# Patient Record
Sex: Male | Born: 1965 | Hispanic: No | Marital: Single | Smoking: Current every day smoker
Health system: Southern US, Community
[De-identification: ages and names within clinical notes are randomized; demographics above are authoritative.]

## PROBLEM LIST (undated history)

## (undated) DIAGNOSIS — F1010 Alcohol abuse, uncomplicated: Secondary | ICD-10-CM

## (undated) DIAGNOSIS — J449 Chronic obstructive pulmonary disease, unspecified: Secondary | ICD-10-CM

## (undated) HISTORY — PX: ELEVATION OF DEPRESSED SKULL FRACTURE: SUR431

## (undated) HISTORY — PX: SHOULDER SURGERY: SHX246

---

## 2017-11-11 ENCOUNTER — Inpatient Hospital Stay (HOSPITAL_COMMUNITY)
Admission: EM | Admit: 2017-11-11 | Discharge: 2017-11-18 | DRG: 193 | Disposition: A | Discharge status: Home / Self Care | Payer: Self-pay | Source: Other Acute Inpatient Hospital | Attending: Internal Medicine | Admitting: Internal Medicine

## 2017-11-11 ENCOUNTER — Encounter (HOSPITAL_COMMUNITY): Payer: Self-pay | Admitting: Internal Medicine

## 2017-11-11 ENCOUNTER — Other Ambulatory Visit: Payer: Self-pay

## 2017-11-11 DIAGNOSIS — F17200 Nicotine dependence, unspecified, uncomplicated: Secondary | ICD-10-CM

## 2017-11-11 DIAGNOSIS — J984 Other disorders of lung: Secondary | ICD-10-CM

## 2017-11-11 DIAGNOSIS — F1020 Alcohol dependence, uncomplicated: Secondary | ICD-10-CM | POA: Diagnosis present

## 2017-11-11 DIAGNOSIS — D649 Anemia, unspecified: Secondary | ICD-10-CM | POA: Diagnosis present

## 2017-11-11 DIAGNOSIS — Z6822 Body mass index (BMI) 22.0-22.9, adult: Secondary | ICD-10-CM

## 2017-11-11 DIAGNOSIS — K769 Liver disease, unspecified: Secondary | ICD-10-CM | POA: Diagnosis present

## 2017-11-11 DIAGNOSIS — J188 Other pneumonia, unspecified organism: Principal | ICD-10-CM | POA: Diagnosis present

## 2017-11-11 DIAGNOSIS — Z79891 Long term (current) use of opiate analgesic: Secondary | ICD-10-CM

## 2017-11-11 DIAGNOSIS — F1021 Alcohol dependence, in remission: Secondary | ICD-10-CM

## 2017-11-11 DIAGNOSIS — I10 Essential (primary) hypertension: Secondary | ICD-10-CM | POA: Diagnosis present

## 2017-11-11 DIAGNOSIS — J449 Chronic obstructive pulmonary disease, unspecified: Secondary | ICD-10-CM | POA: Diagnosis present

## 2017-11-11 DIAGNOSIS — R911 Solitary pulmonary nodule: Secondary | ICD-10-CM | POA: Diagnosis present

## 2017-11-11 DIAGNOSIS — Z7951 Long term (current) use of inhaled steroids: Secondary | ICD-10-CM

## 2017-11-11 DIAGNOSIS — Z8042 Family history of malignant neoplasm of prostate: Secondary | ICD-10-CM

## 2017-11-11 DIAGNOSIS — J9589 Other postprocedural complications and disorders of respiratory system, not elsewhere classified: Secondary | ICD-10-CM

## 2017-11-11 DIAGNOSIS — Z803 Family history of malignant neoplasm of breast: Secondary | ICD-10-CM

## 2017-11-11 DIAGNOSIS — F17210 Nicotine dependence, cigarettes, uncomplicated: Secondary | ICD-10-CM | POA: Diagnosis present

## 2017-11-11 DIAGNOSIS — Z79899 Other long term (current) drug therapy: Secondary | ICD-10-CM

## 2017-11-11 DIAGNOSIS — J189 Pneumonia, unspecified organism: Secondary | ICD-10-CM

## 2017-11-11 DIAGNOSIS — Z716 Tobacco abuse counseling: Secondary | ICD-10-CM

## 2017-11-11 DIAGNOSIS — J40 Bronchitis, not specified as acute or chronic: Secondary | ICD-10-CM

## 2017-11-11 DIAGNOSIS — E43 Unspecified severe protein-calorie malnutrition: Secondary | ICD-10-CM | POA: Diagnosis present

## 2017-11-11 DIAGNOSIS — E871 Hypo-osmolality and hyponatremia: Secondary | ICD-10-CM | POA: Diagnosis present

## 2017-11-11 DIAGNOSIS — J432 Centrilobular emphysema: Secondary | ICD-10-CM | POA: Diagnosis present

## 2017-11-11 HISTORY — DX: Alcohol abuse, uncomplicated: F10.10

## 2017-11-11 HISTORY — DX: Chronic obstructive pulmonary disease, unspecified: J44.9

## 2017-11-11 LAB — MRSA PCR SCREENING: MRSA BY PCR: NEGATIVE

## 2017-11-11 MED ORDER — IPRATROPIUM-ALBUTEROL 0.5-2.5 (3) MG/3ML IN SOLN: 3.0000 mL | RESPIRATORY_TRACT | Status: DC | PRN

## 2017-11-11 MED ORDER — CARVEDILOL 3.125 MG PO TABS
3.1250 mg | ORAL_TABLET | Freq: Two times a day (BID) | ORAL | Status: DC
  Administered 2017-11-11 – 2017-11-18 (×14): 3.125 mg via ORAL
  Filled 2017-11-11 (×14): qty 1

## 2017-11-11 MED ORDER — NICOTINE 14 MG/24HR TD PT24
14.0000 mg | MEDICATED_PATCH | Freq: Every day | TRANSDERMAL | Status: DC
  Filled 2017-11-11: qty 1

## 2017-11-11 MED ORDER — SODIUM CHLORIDE 0.9 % IV SOLN
INTRAVENOUS | Status: DC
  Administered 2017-11-11 – 2017-11-13 (×5): via INTRAVENOUS

## 2017-11-11 MED ORDER — VITAMIN B-1 100 MG PO TABS
50.0000 mg | ORAL_TABLET | Freq: Two times a day (BID) | ORAL | Status: DC
  Administered 2017-11-11: 50 mg via ORAL
  Administered 2017-11-11: 100 mg via ORAL
  Administered 2017-11-12 – 2017-11-18 (×12): 50 mg via ORAL
  Filled 2017-11-11 (×15): qty 1

## 2017-11-11 MED ORDER — MOMETASONE FURO-FORMOTEROL FUM 200-5 MCG/ACT IN AERO
2.0000 | INHALATION_SPRAY | Freq: Two times a day (BID) | RESPIRATORY_TRACT | Status: DC
  Administered 2017-11-12 – 2017-11-18 (×13): 2 via RESPIRATORY_TRACT
  Filled 2017-11-11 (×3): qty 8.8

## 2017-11-11 MED ORDER — FOLIC ACID 1 MG PO TABS
1.0000 mg | ORAL_TABLET | Freq: Every day | ORAL | Status: DC
  Administered 2017-11-11 – 2017-11-18 (×7): 1 mg via ORAL
  Filled 2017-11-11 (×8): qty 1

## 2017-11-11 MED ORDER — LORAZEPAM 2 MG/ML IJ SOLN: 1.0000 mg | Freq: Four times a day (QID) | INTRAMUSCULAR | Status: AC | PRN

## 2017-11-11 MED ORDER — ENSURE ENLIVE PO LIQD
237.0000 mL | Freq: Two times a day (BID) | ORAL | Status: DC
  Administered 2017-11-11: 237 mL via ORAL

## 2017-11-11 MED ORDER — NICOTINE 14 MG/24HR TD PT24
14.0000 mg | MEDICATED_PATCH | Freq: Every day | TRANSDERMAL | Status: DC
  Administered 2017-11-11 – 2017-11-18 (×8): 14 mg via TRANSDERMAL
  Filled 2017-11-11 (×7): qty 1

## 2017-11-11 MED ORDER — ADULT MULTIVITAMIN W/MINERALS CH
1.0000 | ORAL_TABLET | Freq: Every day | ORAL | Status: DC
  Administered 2017-11-11 – 2017-11-18 (×7): 1 via ORAL
  Filled 2017-11-11 (×8): qty 1

## 2017-11-11 MED ORDER — SODIUM CHLORIDE 0.9 % IV SOLN
3.0000 g | Freq: Four times a day (QID) | INTRAVENOUS | Status: DC
  Administered 2017-11-11 – 2017-11-18 (×27): 3 g via INTRAVENOUS
  Filled 2017-11-11 (×32): qty 3

## 2017-11-11 MED ORDER — NICOTINE 14 MG/24HR TD PT24
1.00 | MEDICATED_PATCH | TRANSDERMAL | Status: DC
Start: 2017-11-12 — End: 2017-11-11

## 2017-11-11 MED ORDER — LORAZEPAM 1 MG PO TABS: 1.0000 mg | ORAL_TABLET | Freq: Four times a day (QID) | ORAL | Status: AC | PRN

## 2017-11-11 NOTE — Progress Notes (Signed)
11/11/2017 Patient came in from [facility] at 1050 to 2W16. The patient is alert, oriented and ambulatory. He complain of feeing weak. Patient receive MRSA and CHG bath. Md was made aware patient arrived on the unit. Patient skin is dry no breakdown on sacrum. Lovie MacadamiaNadine Mellany Dinsmore RN

## 2017-11-11 NOTE — Progress Notes (Signed)
Pharmacy Antibiotic Note  Tanner Hayes is a 52 y.o. male admitted on 11/11/2017  with lung mass on CT and possible aspiration pneumonia.  Pharmacy has been consulted for Unasyn dosing. Obtained from [facility] ED records, last available SCr from 11/10/17 was 0.51 with CrCl ~140 mL/min. Last weight was 61.2kg. WBC was elevated at 13.9. Patient was afebrile. Per ED records, patient was given IV Levaquin 750mg  at 3:37 AM. Blood cultures were obtained in the ED.  Plan: Start Unasyn 3g IV every 6 hours.  Monitor renal function, culture results, and clinical status.      Temp (24hrs), Avg:98.2 F (36.8 C), Min:98.2 F (36.8 C), Max:98.2 F (36.8 C)  No results for input(s): WBC, CREATININE, LATICACIDVEN, VANCOTROUGH, VANCOPEAK, VANCORANDOM, GENTTROUGH, GENTPEAK, GENTRANDOM, TOBRATROUGH, TOBRAPEAK, TOBRARND, AMIKACINPEAK, AMIKACINTROU, AMIKACIN in the last 168 hours.  CrCl cannot be calculated (No order found.).    No Known Allergies  Antimicrobials this admission: 6/20 Unasyn >>  Dose adjustments this admission:   Microbiology results: 6/20 BCx: 6/20 Sputum:   6/20 MRSA PCR:   Thank you for allowing pharmacy to be a part of this patient's care.  Link SnufferJessica Timesha Cervantez, PharmD, BCPS, BCCCP Clinical Pharmacist Clinical phone 11/11/2017 until 11PM [phone]- #[phone] After hours, please call #[phone] 11/11/2017 2:06 PM

## 2017-11-11 NOTE — Consult Note (Signed)
PULMONARY / CRITICAL CARE MEDICINE   Name: Tanner Hayes MRN: [id number] DOB: [birth year]    ADMISSION DATE:  11/11/2017 CONSULTATION DATE:  6/20  REFERRING MD: yates  CHIEF COMPLAINT: Cavitary lung mass  HISTORY OF PRESENT ILLNESS:   52 year old mal with a past medical history significant for alcohol abusee and who has smoked 1 pack of cigarettes daily since his teenage years came to our facility from an outside hospital in the setting of a cavitary lesion in the right upper lobe. He and his cousin provide the history jointly.  He lives alone and has been in and out of jail multiple times over the years and does odd jobs for various people.  For the last month he has been feeling sick.  He has been coughing up more mucus, losing about 10 pounds, and feeling generally weak.  This all started not long after cleaning out a "moldy tractor-trailer".  About a week ago he had a sudden onset of right-sided chest pain which is associated with some nausea and vomiting.  Since then he has not had nausea and vomiting but he continues to feel weak and cough of mucus.  He went to an outside facility where he was evaluated and discharged home.  He was told that point that he had evidence of liver disease and an infection.  His weakness continued so his family took him to another hospital where he was found to have a cavitary lesion in the right upper lobe.  They recommended transfer to another facility and he ended up with us here at [facility].  He says the pain is feeling better now.  He also notes that he drinks beer on a daily basis.  Typically between 6 and 12 beers a day.  PAST MEDICAL HISTORY :  He  has a past medical history of Alcohol abuse and COPD (chronic obstructive pulmonary disease) (HCC).  PAST SURGICAL HISTORY: He  has a past surgical history that includes Shoulder surgery and Elevation of depressed skull fracture.  No Known Allergies  No current facility-administered  medications on file prior to encounter.    Current Outpatient Medications on File Prior to Encounter  Medication Sig  . budesonide-formoterol (SYMBICORT) 160-4.5 MCG/ACT inhaler Inhale 2 puffs into the lungs 2 (two) times daily.  . carvedilol (COREG) 3.125 MG tablet Take 3.125 mg by mouth 2 (two) times daily with a meal.  . Fluticasone-Salmeterol (ADVAIR DISKUS IN) Inhale 1 puff into the lungs 2 (two) times daily.  . folic acid (FOLVITE) 1 MG tablet Take 1 mg by mouth daily.  . Multiple Vitamins-Minerals (ONE-A-DAY 50 PLUS PO) Take 1 tablet by mouth daily.  . nicotine (NICODERM CQ - DOSED IN MG/24 HOURS) 14 mg/24hr patch Place 14 mg onto the skin daily.  Marland Kitchen. thiamine (VITAMIN B-1) 50 MG tablet Take 50 mg by mouth 2 (two) times daily.    FAMILY HISTORY:  His indicated that his mother is deceased. He indicated that his father is deceased.   SOCIAL HISTORY: He  reports that he has been smoking.  He has a 45.00 pack-year smoking history. He has never used smokeless tobacco. He reports that he drinks about 42.0 oz of alcohol per week. He reports that he does not use drugs.  REVIEW OF SYSTEMS:   Gen: Denies fever, chills, + weight change, fatigue, night sweats HEENT: Denies blurred vision, double vision, hearing loss, tinnitus, sinus congestion, rhinorrhea, sore throat, neck stiffness, dysphagia PULM: per HPI CV: Denies chest pain, edema, orthopnea,  paroxysmal nocturnal dyspnea, palpitations GI: Denies abdominal pain, nausea, vomiting, diarrhea, hematochezia, melena, constipation, change in bowel habits GU: Denies dysuria, hematuria, polyuria, oliguria, urethral discharge Endocrine: Denies hot or cold intolerance, polyuria, polyphagia or appetite change Derm: Denies rash, dry skin, scaling or peeling skin change Heme: Denies easy bruising, bleeding, bleeding gums Neuro: Denies headache, numbness, weakness, slurred speech, loss of memory or consciousness  SUBJECTIVE:  As above  VITAL  SIGNS: Blood Pressure (Abnormal) 154/136 (BP Location: Right Arm)   Pulse 90   Temperature 98.2 F (36.8 C) (Oral)   Respiration 18   Height 5\' 3"  (1.6 m)   Weight 125 lb (56.7 kg)   Oxygen Saturation 98%   Body Mass Index 22.14 kg/m   HEMODYNAMICS:    VENTILATOR SETTINGS:    INTAKE / OUTPUT: No intake/output data recorded.  PHYSICAL EXAMINATION:  Gen: chronhically ill appearing, no acute distress HENT: NCAT, OP poor dentition, neck supple without masses Eyes: PERRL, EOMi Lymph: no cervical lymphadenopathy PULM: few wheezes CV: RRR, no mgr, no JVD GI: BS+, soft, nontender, no hsm Derm: no rash or skin breakdown MSK: normal bulk and tone Neuro: A&Ox4, CN II-XII intact, strength 5/5 in all 4 extremities Psyche: normal mood and affect   LABS:  BMET No results for input(s): NA, K, CL, CO2, BUN, CREATININE, GLUCOSE in the last 168 hours.  Electrolytes No results for input(s): CALCIUM, MG, PHOS in the last 168 hours.  CBC No results for input(s): WBC, HGB, HCT, PLT in the last 168 hours.  Coag's No results for input(s): APTT, INR in the last 168 hours.  Sepsis Markers No results for input(s): LATICACIDVEN, PROCALCITON, O2SATVEN in the last 168 hours.  ABG No results for input(s): PHART, PCO2ART, PO2ART in the last 168 hours.  Liver Enzymes No results for input(s): AST, ALT, ALKPHOS, BILITOT, ALBUMIN in the last 168 hours.  Cardiac Enzymes No results for input(s): TROPONINI, PROBNP in the last 168 hours.  Glucose No results for input(s): GLUCAP in the last 168 hours.  Imaging No results found.   STUDIES:  CT chest from outside hospital last week reviewed showing minimal centrilobular emphysema and a cavitary thick-walled mass adjacent to the mediastinum in the right upper lobe noted.  There is surrounding groundglass.  CULTURES: 6/20 sputum culture 6/20 sputum fungus 6/20 sputum AFB 6/20 quantiferon gold  ANTIBIOTICS: June 20 Unasyn>    SIGNIFICANT EVENTS:   LINES/TUBES:   DISCUSSION: 52 year old smoker with weight loss mucus production and a cavitary right upper lobe lesion.  He has poor dentition, drinks heavily and has been in jail multiple times over the years.  The differential diagnosis here is broad.  I think the most likely explanation here is that he has been getting drunk frequently and falling asleep and aspirating as the posterior segment of the right upper lobe is not an uncommon location for that in that scenario.  The differential diagnosis clearly includes tuberculosis given his gel history and clinical findings as well as malignancy.  I think a less likely consideration would be something fungal.  ASSESSMENT / PLAN:  PULMONARY A: Cavitary RUL lesion in a smoker with poor dentition and jail history smoker P:   Continue Unasyn Sputum culture Sputum AFB x3 Sputum fungus culture QuantiFERON gold Airborne precautions Urine histo antigen If these tests are negative then we can consider a bronchoscopy early next week  [place], MD [facility] Pager: [phone] Cell: [phone] After 3pm or if no response, call [phone]  11/11/2017, 3:47  PM   

## 2017-11-11 NOTE — Progress Notes (Signed)
We were called for pulmonary consult we acknowledged it. There is no H and P or labs in records to review yet will follow up later on today or tomorrow am and evaluate patient  Tanner Hayes [facility]

## 2017-11-11 NOTE — Care Management Note (Addendum)
Case Management Note  Patient Details  Name: Tanner Hayes MRN: [id number] Date of Birth: 1966-02-11  Subjective/Objective:   From home alone, presents with  Cavitary esion of lung, copd, etoh dependence , tobacco depend.  Patient states his pcp is [facility] in [place] [phone] and he has a follow up apt on 7/15, he is on a program there for his inhalers and his sister Tanner Hayes pays for his other medications which comes to total of 20.00.  He will be going to sisters house at [employer], her address is [address], [place] [id number] [facility] 2 hrs away.               Action/Plan: DC home when ready.  Expected Discharge Date:                  Expected Discharge Plan:  Home/Self Care  In-House Referral:     Discharge planning Services  CM Consult  Post Acute Care Choice:    Choice offered to:     DME Arranged:    DME Agency:     HH Arranged:    HH Agency:     Status of Service:  In process, will continue to follow  If discussed at Long Length of Stay Meetings, dates discussed:    Additional Comments:  Tanner Hayes, Tanner Curet [place], RN 11/11/2017, 2:52 PM

## 2017-11-11 NOTE — H&P (Signed)
History and Physical    Tanner Hayes SRP:[id number] DOB: 10/09/1965 DOA: 11/11/2017  PCP:  [facility], [place], [place] - Dr. Glennon Mac Consultants:  None Patient coming from:  Home - lives alone; NOK: Sister, [phone]  Chief Complaint:  Cavitary lung mass  HPI: Tanner Hayes is a 52 y.o. male with medical history significant of  COPD and ETOH abuse presenting as a transfer from [facility].  He reports, "I'm hurting."  He went to the hospital last Thursday with right-sided pain just inferior to his breast since Tuesday.  Much worse with coughing.  No change in cough freuquency, but he has been coughing up yellowish-brownish sptuum.  They went to [facility] in [place].  He was hurting so much he couldn't sit still.  In [place], they did blood work and a non-contrast CT.  His WBC count was high and there was reported "liver damage".  The doctor was reportedly concerned about a bacterial infection.  He was kept overnight but he was not discharged with any antibiotics.  New meds: MVI, B1 BID, folate 1 mg; Coreg 3.125 mg BID; Chlordiazepoxide taper for ETOH withdrawal.  His sister has also been giving him Tylenol Severe Cough and Cold.  He has not gotten any better since discharge.  Last night, he was too weak to move and so his sister insisted that they go to [facility].    Current symptoms: SOB - has chronic asthma and bronchitis, unchanged from usual.  Ongoing chest pain but better tan it was.  Cough as above.  He had emesis last week but not this week.  Generalized weaknes. His sister reports that he is confused.  +weight loss, reports anorexia last week.  Yesterday, he ate a cheeseburger Happy Meal and drank 4 boosts.  No night sweats.  D/C summary from [facility], 6/14: -atypical chest pain, ruled out MI -chronic alcoholism -protein calorie malnutrition moderate -hyponatremia (Na++ 128) secondary to beer potomania -COPD by  history -elevated transaminases secondary to alcohol (AST 147, ALT 143)  WBC count 20,000, no focus of infection identified.  Given Rocephin x 1.  Improving LFTs, likely elevated from ETOH.  Placed on a librium taper for ETOH withdrawal.  Hepatitis panel negative.  UDS negative.  CRP 22.0 ESR 20  ER doctor note from [facility] today reports "CT did show what appears to be a large 10 x 4.8 x 4.2 cm cavitary mass in the right upper lobe extending into the right hilum with what looks to be evidence of invasion into the right posterior superior mediastinum... Patient with a large mass that appears to be a neoplasm according to the radiologist."  ED Course:  Per overnight report from Dr. Hal Hope, patient "was recently admitted for chest pain rule out.  CT angio chest showed 10 cm lung mass invading the mediastinum.  No beds in other hospital and due to persistent pain and invading mass accepted for further management.  Labs as per EDP nothing abnormal."  Review of Systems: As per HPI; otherwise review of systems reviewed and negative.   Ambulatory Status:  Ambulates without assistance  Past Medical History:  Diagnosis Date  . Alcohol abuse   . COPD (chronic obstructive pulmonary disease) ([place])     Past Surgical History:  Procedure Laterality Date  . ELEVATION OF DEPRESSED SKULL FRACTURE    . SHOULDER SURGERY      Social History   Socioeconomic History  . Marital status: Single    Spouse name: Not on  file  . Number of children: Not on file  . Years of education: Not on file  . Highest education level: Not on file  Occupational History  . Occupation: unemployed  Social Needs  . Financial resource strain: Not on file  . Food insecurity:    Worry: Not on file    Inability: Not on file  . Transportation needs:    Medical: Not on file    Non-medical: Not on file  Tobacco Use  . Smoking status: Current Every Day Smoker    Packs/day: 1.00    Years: 45.00    Pack years: 45.00  .  Smokeless tobacco: Never Used  Substance and Sexual Activity  . Alcohol use: Yes    Alcohol/week: 42.0 oz    Types: 70 Cans of beer per week    Comment: last beer was a week ago, but he has been drinking O'Doul's 6/day  . Drug use: Never  . Sexual activity: Not on file  Lifestyle  . Physical activity:    Days per week: Not on file    Minutes per session: Not on file  . Stress: Not on file  Relationships  . Social connections:    Talks on phone: Not on file    Gets together: Not on file    Attends religious service: Not on file    Active member of club or organization: Not on file    Attends meetings of clubs or organizations: Not on file    Relationship status: Not on file  . Intimate partner violence:    Fear of current or ex partner: Not on file    Emotionally abused: Not on file    Physically abused: Not on file    Forced sexual activity: Not on file  Other Topics Concern  . Not on file  Social History Narrative  . Not on file    No Known Allergies  Family History  Problem Relation Age of Onset  . Breast cancer Mother   . Prostate cancer Father     Prior to Admission medications   Medication Sig Start Date End Date Taking? Authorizing Provider  budesonide-formoterol (SYMBICORT) 160-4.5 MCG/ACT inhaler Inhale 2 puffs into the lungs 2 (two) times daily.   Yes [provider]  carvedilol (COREG) 3.125 MG tablet Take 3.125 mg by mouth 2 (two) times daily with a meal.   Yes [provider]  chlordiazePOXIDE (LIBRIUM) 25 MG capsule Take 50 mg by mouth 3 (three) times daily as needed for anxiety.   Yes [provider]  folic acid (FOLVITE) 1 MG tablet Take 1 mg by mouth daily.   Yes [provider]  Multiple Vitamins-Minerals (ONE-A-DAY 50 PLUS PO) Take 1 tablet by mouth daily.   Yes [provider]  nicotine (NICODERM CQ - DOSED IN MG/24 HOURS) 14 mg/24hr patch Place 14 mg onto the skin daily.   Yes [provider]   thiamine (VITAMIN B-1) 50 MG tablet Take 50 mg by mouth 2 (two) times daily.   Yes [provider]    Physical Exam: Vitals:   11/11/17 1100  BP: (!) 154/136  Resp: 18  Temp: 98.2 F (36.8 C)  TempSrc: Oral  SpO2: 98%     General:  Appears calm and comfortable and is NAD; he appears somewhat frail and ill - acute vs. chronic Eyes:  PERRL, EOMI, normal lids, iris ENT:  grossly normal hearing, lips & tongue, mmm; extremely poor dentition Neck:  no LAD, masses  or thyromegaly; no carotid bruits Cardiovascular:  RRR, no m/r/g. No LE edema.  Respiratory:   CTA bilaterally with no wheezes/rales/rhonchi.  Normal respiratory effort. Abdomen:  soft, NT, ND, NABS Back:   normal alignment, no CVAT Skin:  no rash or induration seen on limited exam Musculoskeletal:  grossly normal tone BUE/BLE, good ROM, no bony abnormality Psychiatric:  flat mood and affect, speech fluent and appropriate, AOx3 Neurologic:  CN 2-12 grossly intact, moves all extremities in coordinated fashion, sensation intact    Radiological Exams on Admission: No results found.  EKG: none   Labs on Admission: I have personally reviewed the available labs and imaging studies at the time of the admission.  Pertinent labs:   CMP: generally unremarkable D-dimer 497 Lipase negative Troponin <0.02 WBC 13.9 Hgb 12.2  Platelets 568 UA: WNL CT - cavitary mass RUL with associated infiltrate, borderline LAD; advanced emphysema   Assessment/Plan Active Problems:   Cavitary lesion of lung   COPD with chronic bronchitis (HCC)   Tobacco dependence   Alcohol dependence ([place])   Cavitary lesion of lung -The initial report from the OSH appeared to indicate that the patient had a 10 cm lung mass invading the mediastinum -The report from radiology in [facility], however, appears to indicate a 4 cm cavitary mass with surrounding infiltrate --He has been started on Unasyn for empiric treatment of a cavitary PNA,  likely resulting from his extremely poor dentition -The patient and his sister are also aware that there is still ongoing concern for a malignancy as the cause and that bronchoscopy may be needed -I have spoken with Dr. Manuella Ghazi, and he has agreed to see the patient in consultation.  COPD -Will continue home Symbicort -Will add prn Duonebs -He does not appear to have an active exacerbation at this time  ETOH dependence -Patient with long-standing ETOH dependence -His last drink was 1 week ago -He has been on a Librium taper -Will place on CIWA protocol, but low suspicion for frank withdrawal at this time -Continue PO MVI; thiamine and folate -Ongoing cessation is crucial -While there was noted abnormal LFTs in [place], his LFTs this AM appear to have normalized  Tobacco dependence -Encourage cessation.  This was discussed with the patient and should be reviewed on an ongoing basis.   -Patch ordered at patient request.    DVT prophylaxis:  SCDs Code Status:  Full - confirmed with patient/family Family Communication: Sister present throughout evaluation  Disposition Plan:  Home once clinically improved Consults called: Pulmonology; PT/OT/RT/CM/Nutrition/SW  Admission status: Admit - It is my clinical opinion that admission to INPATIENT is reasonable and necessary because of the expectation that this patient will require hospital care that crosses at least 2 midnights to treat this condition based on the medical complexity of the problems presented.  Given the aforementioned information, the predictability of an adverse outcome is felt to be significant.    Karmen Bongo MD [facility]  If note is complete, please contact covering daytime or nighttime physician. [email or web address] Password [email or web address]  11/11/2017, 2:13 PM

## 2017-11-12 LAB — CBC WITH DIFFERENTIAL/PLATELET
ABS IMMATURE GRANULOCYTES: 0.4 10*3/uL — AB (ref 0.0–0.1)
Basophils Absolute: 0.1 10*3/uL (ref 0.0–0.1)
Basophils Relative: 1 %
Eosinophils Absolute: 0.3 10*3/uL (ref 0.0–0.7)
Eosinophils Relative: 3 %
HEMATOCRIT: 35.9 % — AB (ref 39.0–52.0)
HEMOGLOBIN: 11.8 g/dL — AB (ref 13.0–17.0)
Immature Granulocytes: 3 %
LYMPHS ABS: 1.8 10*3/uL (ref 0.7–4.0)
LYMPHS PCT: 15 %
MCH: 30.8 pg (ref 26.0–34.0)
MCHC: 32.9 g/dL (ref 30.0–36.0)
MCV: 93.7 fL (ref 78.0–100.0)
MONO ABS: 2 10*3/uL — AB (ref 0.1–1.0)
MONOS PCT: 16 %
NEUTROS ABS: 7.7 10*3/uL (ref 1.7–7.7)
Neutrophils Relative %: 62 %
PLATELETS: 653 10*3/uL — AB (ref 150–400)
RBC: 3.83 MIL/uL — ABNORMAL LOW (ref 4.22–5.81)
RDW: 14.3 % (ref 11.5–15.5)
WBC: 12.3 10*3/uL — ABNORMAL HIGH (ref 4.0–10.5)

## 2017-11-12 LAB — BASIC METABOLIC PANEL
Anion gap: 6 (ref 5–15)
BUN: 6 mg/dL (ref 6–20)
CHLORIDE: 101 mmol/L (ref 101–111)
CO2: 28 mmol/L (ref 22–32)
Calcium: 9.1 mg/dL (ref 8.9–10.3)
Creatinine, Ser: 0.6 mg/dL — ABNORMAL LOW (ref 0.61–1.24)
GFR calc Af Amer: >60 mL/min (ref >60)
GFR calc non Af Amer: >60 mL/min (ref >60)
GLUCOSE: 100 mg/dL — AB (ref 65–99)
Potassium: 4.2 mmol/L (ref 3.5–5.1)
Sodium: 135 mmol/L (ref 135–145)

## 2017-11-12 LAB — HIV ANTIBODY (ROUTINE TESTING W REFLEX): HIV Screen 4th Generation wRfx: NONREACTIVE

## 2017-11-12 LAB — EXPECTORATED SPUTUM ASSESSMENT W GRAM STAIN, RFLX TO RESP C

## 2017-11-12 MED ORDER — OXYCODONE HCL 5 MG PO TABS
5.0000 mg | ORAL_TABLET | ORAL | Status: DC | PRN
  Administered 2017-11-12: 5 mg via ORAL
  Filled 2017-11-12: qty 1

## 2017-11-12 MED ORDER — ENSURE ENLIVE PO LIQD
237.0000 mL | Freq: Two times a day (BID) | ORAL | Status: DC
  Administered 2017-11-12 – 2017-11-18 (×10): 237 mL via ORAL

## 2017-11-12 MED ORDER — SODIUM CHLORIDE 3 % IN NEBU
4.0000 mL | INHALATION_SOLUTION | Freq: Every day | RESPIRATORY_TRACT | Status: AC
  Administered 2017-11-12 – 2017-11-14 (×3): 4 mL via RESPIRATORY_TRACT
  Filled 2017-11-12 (×3): qty 4

## 2017-11-12 NOTE — Progress Notes (Addendum)
Patient ID: Tanner Hayes, male   DOB: 02-Sep-1965, 52 y.o.   MRN: [id number]                                                                PROGRESS NOTE                                                                                                                                                                                                             Patient Demographics:    Tanner Hayes, is a 52 y.o. male, DOB - 1966-05-03, GEX:[id number]  Admit date - 11/11/2017   Admitting Physician Rise Patience, MD  Outpatient Primary MD for the patient is Default, Provider, MD  LOS - 1   PCP:  [facility], [place], [place] - Dr. Glennon Mac Consultants:  None Patient coming from:  Home - lives alone; NOK: Sister, [phone]    Outpatient Specialists:    No chief complaint on file. Cavitary lung mass     Brief Narrative     52 y.o. male with medical history significant of  COPD and ETOH abuse presenting as a transfer from [facility].  He reports, "I'm hurting."  He went to the hospital last Thursday with right-sided pain just inferior to his breast since Tuesday.  Much worse with coughing.  No change in cough freuquency, but he has been coughing up yellowish-brownish sptuum.  They went to [facility] in [place].  He was hurting so much he couldn't sit still.  In [place], they did blood work and a non-contrast CT.  His WBC count was high and there was reported "liver damage".  The doctor was reportedly concerned about a bacterial infection.  He was kept overnight but he was not discharged with any antibiotics.  New meds: MVI, B1 BID, folate 1 mg; Coreg 3.125 mg BID; Chlordiazepoxide taper for ETOH withdrawal.  His sister has also been giving him Tylenol Severe Cough and Cold.  He has not gotten any better since discharge.  Last night, he was too weak to move and so his sister insisted that they go to [facility].    Current symptoms: SOB - has  chronic asthma and bronchitis, unchanged from usual.  Ongoing chest pain but better tan it was.  Cough as above.  He had  emesis last week but not this week.  Generalized weaknes. His sister reports that he is confused.  +weight loss, reports anorexia last week.  Yesterday, he ate a cheeseburger Happy Meal and drank 4 boosts.  No night sweats.  D/C summary from [facility], 6/14: -atypical chest pain, ruled out MI -chronic alcoholism -protein calorie malnutrition moderate -hyponatremia (Na++ 128) secondary to beer potomania -COPD by history -elevated transaminases secondary to alcohol (AST 147, ALT 143)  WBC count 20,000, no focus of infection identified.  Given Rocephin x 1.  Improving LFTs, likely elevated from ETOH.  Placed on a librium taper for ETOH withdrawal.  Hepatitis panel negative.  UDS negative.  CRP 22.0 ESR 20  ER doctor note from [facility] today reports "CT did show what appears to be a large 10 x 4.8 x 4.2 cm cavitary mass in the right upper lobe extending into the right hilum with what looks to be evidence of invasion into the right posterior superior mediastinum... Patient with a large mass that appears to be a neoplasm according to the radiologist."  ED Course:  Per overnight report from Dr. Hal Hope, patient "was recently admitted for chest pain rule out.  CT angio chest showed 10 cm lung mass invading the mediastinum.  No beds in other hospital and due to persistent pain and invading mass accepted for further management.  Labs as per EDP nothing abnormal."    Subjective:    Tanner Hayes today has been afebrile.  Unable to cough into canister yet.    No headache, No chest pain, No abdominal pain - No Nausea, No new weakness tingling or numbness, No SOB.    Assessment  & Plan :    Active Problems:   Cavitary lesion of lung   COPD with chronic bronchitis (HCC)   Tobacco dependence   Alcohol dependence ([place])   Cavitary RUL lesion in smoker with poor  dentition and jail history and smoker Continue Unasyn (covers klebsiella) Sputum culture Sputum AFB x3 Sputum fungus culture QuantiFERON gold Airborne precautions Urine histo antigen If these tests are negative then bronchoscopy possibly next week  Copd Cont symbicort Cont Duoneb prn  ETOH dep CIWA  Nicotine dep Nicotine patch prn    Code Status :  FULL CODE  Family Communication  : w patient  Disposition Plan  :  home  Barriers For Discharge :   Consults  :  Pulmonary   Procedures  :    DVT Prophylaxis  :  Lovenox  SCDs  Lab Results  Component Value Date   PLT 653 (H) 11/12/2017    Antibiotics  :  Unavsyn 6/20=>  Anti-infectives (From admission, onward)   Start     Dose/Rate Route Frequency Ordered Stop   11/11/17 1500  Ampicillin-Sulbactam (UNASYN) 3 g in sodium chloride 0.9 % 100 mL IVPB     3 g 200 mL/hr over 30 Minutes Intravenous Every 6 hours 11/11/17 1422          Objective:   Vitals:   11/11/17 1650 11/11/17 2246 11/11/17 2321 11/12/17 0521  BP: 107/65 (!) 128/53  117/77  Pulse: (!) 105 (!) 103  90  Resp: 18 18  18   Temp: 97.9 F (36.6 C) 98.4 F (36.9 C)  99 F (37.2 C)  TempSrc: Oral Oral  Oral  SpO2: 100% 100% 100% 100%  Weight:  56.9 kg (125 lb 7.1 oz)    Height:  5' 3"  (1.6 m)      Wt Readings from  Last 3 Encounters:  11/11/17 56.9 kg (125 lb 7.1 oz)     Intake/Output Summary (Last 24 hours) at 11/12/2017 1013 Last data filed at 11/12/2017 0400 Gross per 24 hour  Intake 1285.55 ml  Output 950 ml  Net 335.55 ml     Physical Exam  Awake Alert, Oriented X 3, No new F.N deficits, Normal affect [place].AT,PERRAL Supple Neck,No JVD, No cervical lymphadenopathy appriciated.  Symmetrical Chest wall movement, Good air movement bilaterally, decrease in bs in RUL RRR,No Gallops,Rubs or new Murmurs, No Parasternal Heave +ve B.Sounds, Abd Soft, No tenderness, No organomegaly appriciated, No rebound - guarding or rigidity. No  Cyanosis, Clubbing or edema, No new Rash or bruise       Data Review:    CBC Recent Labs  Lab 11/12/17 0709  WBC 12.3*  HGB 11.8*  HCT 35.9*  PLT 653*  MCV 93.7  MCH 30.8  MCHC 32.9  RDW 14.3  LYMPHSABS 1.8  MONOABS 2.0*  EOSABS 0.3  BASOSABS 0.1    Chemistries  Recent Labs  Lab 11/12/17 0709  NA 135  K 4.2  CL 101  CO2 28  GLUCOSE 100*  BUN 6  CREATININE 0.60*  CALCIUM 9.1   ------------------------------------------------------------------------------------------------------------------ No results for input(s): CHOL, HDL, LDLCALC, TRIG, CHOLHDL, LDLDIRECT in the last 72 hours.  No results found for: HGBA1C ------------------------------------------------------------------------------------------------------------------ No results for input(s): TSH, T4TOTAL, T3FREE, THYROIDAB in the last 72 hours.  Invalid input(s): FREET3 ------------------------------------------------------------------------------------------------------------------ No results for input(s): VITAMINB12, FOLATE, FERRITIN, TIBC, IRON, RETICCTPCT in the last 72 hours.  Coagulation profile No results for input(s): INR, PROTIME in the last 168 hours.  No results for input(s): DDIMER in the last 72 hours.  Cardiac Enzymes No results for input(s): CKMB, TROPONINI, MYOGLOBIN in the last 168 hours.  Invalid input(s): CK ------------------------------------------------------------------------------------------------------------------ No results found for: BNP  Inpatient Medications  Scheduled Meds: . carvedilol  3.125 mg Oral BID WC  . feeding supplement (ENSURE ENLIVE)  237 mL Oral BID BM  . folic acid  1 mg Oral Daily  . mometasone-formoterol  2 puff Inhalation BID  . multivitamin with minerals  1 tablet Oral Daily  . nicotine  14 mg Transdermal Daily  . thiamine  50 mg Oral BID   Continuous Infusions: . sodium chloride 75 mL/hr at 11/12/17 0326  . ampicillin-sulbactam (UNASYN) IV 3  g (11/12/17 0811)   PRN Meds:.ipratropium-albuterol, LORazepam **OR** LORazepam  Micro Results Recent Results (from the past 240 hour(s))  MRSA PCR Screening     Status: None   Collection Time: 11/11/17  1:28 PM  Result Value Ref Range Status   MRSA by PCR NEGATIVE NEGATIVE Final    Comment:        The GeneXpert MRSA Assay (FDA approved for NASAL specimens only), is one component of a comprehensive MRSA colonization surveillance program. It is not intended to diagnose MRSA infection nor to guide or monitor treatment for MRSA infections. Performed at [facility], [place] [address], [place],  [address]     Radiology Reports No results found.  Time Spent in minutes  30   Jani Gravel M.D on 11/12/2017 at 10:13 AM  Between 7am to 7pm - Pager - [phone]    After 7pm go to [email or web address] - password [email or web address]  [facility] -  Office  [phone]

## 2017-11-12 NOTE — Progress Notes (Signed)
PULMONARY / CRITICAL CARE MEDICINE   Name: Tanner Hayes MRN: [id number] DOB: 12-03-1965    ADMISSION DATE:  11/11/2017 CONSULTATION DATE:  6/20  REFERRING MD: yates  CHIEF COMPLAINT: Cavitary lung mass  HISTORY OF PRESENT ILLNESS:   52 year old mal with a past medical history significant for alcohol abusee and who has smoked 1 pack of cigarettes daily since his teenage years came to our facility from an outside hospital in the setting of a cavitary lesion in the right upper lobe. He and his cousin provide the history jointly.  He lives alone and has been in and out of jail multiple times over the years and does odd jobs for various people.  For the last month he has been feeling sick.  He has been coughing up more mucus, losing about 10 pounds, and feeling generally weak.  This all started not long after cleaning out a "moldy tractor-trailer".  About a week ago he had a sudden onset of right-sided chest pain which is associated with some nausea and vomiting.  Since then he has not had nausea and vomiting but he continues to feel weak and cough of mucus.  He went to an outside facility where he was evaluated and discharged home.  He was told that point that he had evidence of liver disease and an infection.  His weakness continued so his family took him to another hospital where he was found to have a cavitary lesion in the right upper lobe.  They recommended transfer to another facility and he ended up with us here at [facility].  He says the pain is feeling better now.  He also notes that he drinks beer on a daily basis.  Typically between 6 and 12 beers a day.    SUBJECTIVE:  Continues to report sharp right-sided chest pain  VITAL SIGNS: BP 117/77 (BP Location: Right Arm)   Pulse 90   Temp 99 F (37.2 C) (Oral)   Resp 18   Ht 5\' 3"  (1.6 m)   Wt 56.9 kg (125 lb 7.1 oz)   SpO2 100%   BMI 22.22 kg/m   HEMODYNAMICS:    VENTILATOR SETTINGS: FiO2 (%):  [28 %] 28  %  INTAKE / OUTPUT: I/O last 3 completed shifts: In: 1285.6 [P.O.:320; I.V.:765.6; IV Piggyback:200] Out: 950 [Urine:950]  PHYSICAL EXAMINATION:  General: Thin cachectic male no acute distress complaints of right-sided chest pain HEENT: MM pink/moist, no JVD lymphadenopathy Neuro: Appears intact follows commands CV: Rate and rhythm PULM: Coarse rhonchi with expiratory wheezes noted more left than right WU:JWJXGI:soft, non-tender, bsx4 active  Extremities: warm/dry, negative edema  Skin: no rashes or lesions   LABS:  BMET Recent Labs  Lab 11/12/17 0709  NA 135  K 4.2  CL 101  CO2 28  BUN 6  CREATININE 0.60*  GLUCOSE 100*    Electrolytes Recent Labs  Lab 11/12/17 0709  CALCIUM 9.1    CBC Recent Labs  Lab 11/12/17 0709  WBC 12.3*  HGB 11.8*  HCT 35.9*  PLT 653*    Coag's No results for input(s): APTT, INR in the last 168 hours.  Sepsis Markers No results for input(s): LATICACIDVEN, PROCALCITON, O2SATVEN in the last 168 hours.  ABG No results for input(s): PHART, PCO2ART, PO2ART in the last 168 hours.  Liver Enzymes No results for input(s): AST, ALT, ALKPHOS, BILITOT, ALBUMIN in the last 168 hours.  Cardiac Enzymes No results for input(s): TROPONINI, PROBNP in the last 168 hours.  Glucose No  results for input(s): GLUCAP in the last 168 hours.  Imaging No results found.   STUDIES:  CT chest from outside hospital last week reviewed showing minimal centrilobular emphysema and a cavitary thick-walled mass adjacent to the mediastinum in the right upper lobe noted.  There is surrounding groundglass.  CULTURES: 6/20 sputum culture 6/20 sputum fungus 6/20 sputum AFB 6/20 quantiferon gold  ANTIBIOTICS: June 20 Unasyn>   SIGNIFICANT EVENTS:   LINES/TUBES:   DISCUSSION: 52 year old smoker with weight loss mucus production and a cavitary right upper lobe lesion.  He has poor dentition, drinks heavily and has been in jail multiple times over the  years.  The differential diagnosis here is broad.  I think the most likely explanation here is that he has been getting drunk frequently and falling asleep and aspirating as the posterior segment of the right upper lobe is not an uncommon location for that in that scenario.  The differential diagnosis clearly includes tuberculosis given his jail history and clinical findings as well as malignancy.  I think a less likely consideration would be something fungal.  11/12/2017 unable to produce sputum at this time.  ASSESSMENT / PLAN:  PULMONARY A: Cavitary RUL lesion in a smoker with poor dentition and jail history smoker P:   Continue current antimicrobial therapy with Unasyn Sputum culture if able to bring up sputum AFB x3, ordered 3 percent saline to induce sputum production 11/12/2017  sputum fungus culture Check QuantiFERON gold May need fiberoptic bronchoscopy early next week if unable to induce sputum production.  Brett Canales Minor ACNP [facility] Pager [phone] till 1 pm If no answer page [phone] 11/12/2017, 11:39 AM

## 2017-11-12 NOTE — Evaluation (Signed)
Physical Therapy Evaluation Patient Details Name: Tanner Hayes MRN: [id number] DOB: 1965/06/15 Today's Date: 11/12/2017   History of Present Illness  Pt is a 52 year old mal with a past medical history significant for alcohol abuse and who has smoked 1 pack of cigarettes daily since his teenage years came to our facility from an outside hospital in the setting of a cavitary lesion in the right upper lobe. Pt being worked up for possible tuberculosis.    Clinical Impression  Pt presented sitting EOB with OT and sister present. Prior to admission, pt reported that he was independent with all functional mobility and ADLs. Pt currently able to perform bed mobility with min guard, transfers with supervision and ambulated within his room with min guard while pushing the IV pole. Overall pt with generalized weakness and deconditioned. Pt would continue to benefit from skilled physical therapy services at this time while admitted and after d/c to address the below listed limitations in order to improve overall safety and independence with functional mobility.\     Follow Up Recommendations Home health PT;Supervision - Intermittent    Equipment Recommendations  None recommended by PT    Recommendations for Other Services       Precautions / Restrictions Precautions Precautions: None Precaution Comments: AIRBORNE PRECAUTIONS Restrictions Weight Bearing Restrictions: No      Mobility  Bed Mobility Overal bed mobility: Needs Assistance Bed Mobility: Sit to Supine     Supine to sit: Min guard Sit to supine: Min guard   General bed mobility comments: for safety   Transfers Overall transfer level: Needs assistance Equipment used: None Transfers: Sit to/from Stand Sit to Stand: Supervision         General transfer comment: for safety   Ambulation/Gait Ambulation/Gait assistance: Min guard Gait Distance (Feet): 50 Feet Assistive device: IV Pole Gait Pattern/deviations:  Step-through pattern;Decreased step length - right;Decreased step length - left;Decreased stride length Gait velocity: decreased Gait velocity interpretation: 1.31 - 2.62 ft/sec, indicative of limited community ambulator General Gait Details: pt steady with one UE support on IV pole, no LOB or need for physical assistance, able to navigate around within his room with no difficulties  Stairs            Wheelchair Mobility    Modified Rankin (Stroke Patients Only)       Balance Overall balance assessment: Mild deficits observed, not formally tested                                           Pertinent Vitals/Pain Pain Assessment: No/denies pain    Home Living Family/patient expects to be discharged to:: Private residence Living Arrangements: Alone Available Help at Discharge: Family;Available PRN/intermittently Type of Home: House Home Access: Stairs to enter   [employer] CorporationEntrance Stairs-Number of Steps: 1 Home Layout: One level Home Equipment: None Additional Comments: reports he stays with his sister when he is sick     Prior Function Level of Independence: Independent               Hand Dominance        Extremity/Trunk Assessment   Upper Extremity Assessment Upper Extremity Assessment: Defer to OT evaluation    Lower Extremity Assessment Lower Extremity Assessment: Generalized weakness    Cervical / Trunk Assessment Cervical / Trunk Assessment: Normal  Communication   Communication: No difficulties  Cognition Arousal/Alertness: Awake/alert Behavior During Therapy:  WFL for tasks assessed/performed Overall Cognitive Status: Within Functional Limits for tasks assessed                                        General Comments      Exercises     Assessment/Plan    PT Assessment Patient needs continued PT services  PT Problem List Decreased strength;Decreased activity tolerance;Decreased balance;Decreased  mobility;Decreased coordination;Decreased knowledge of use of DME;Decreased safety awareness       PT Treatment Interventions DME instruction;Gait training;Stair training;Functional mobility training;Therapeutic activities;Therapeutic exercise;Balance training;Neuromuscular re-education;Patient/family education    PT Goals (Current goals can be found in the Care Plan section)  Acute Rehab PT Goals Patient Stated Goal: improve strength PT Goal Formulation: With patient Time For Goal Achievement: 11/26/17 Potential to Achieve Goals: Good    Frequency Min 2X/week   Barriers to discharge        Co-evaluation               AM-PAC PT "6 Clicks" Daily Activity  Outcome Measure Difficulty turning over in bed (including adjusting bedclothes, sheets and blankets)?: None Difficulty moving from lying on back to sitting on the side of the bed? : None Difficulty sitting down on and standing up from a chair with arms (e.g., wheelchair, bedside commode, etc,.)?: A Little Help needed moving to and from a bed to chair (including a wheelchair)?: None Help needed walking in hospital room?: A Little Help needed climbing 3-5 steps with a railing? : A Little 6 Click Score: 21    End of Session   Activity Tolerance: Patient limited by fatigue Patient left: in bed;with call bell/phone within reach;with family/visitor present Nurse Communication: Mobility status PT Visit Diagnosis: Other abnormalities of gait and mobility (R26.89);Muscle weakness (generalized) (M62.81)    Time: 4098-1191 PT Time Calculation (min) (ACUTE ONLY): 11 min   Charges:   PT Evaluation $PT Eval Low Complexity: 1 Low     PT G Codes:        [place], PT, DPT [phone]   Alessandra Bevels Brittnae Aschenbrenner 11/12/2017, 5:01 PM

## 2017-11-12 NOTE — Evaluation (Signed)
Occupational Therapy Evaluation Patient Details Name: Tanner Hayes MRN: [id number] DOB: Apr 18, 1966 Today's Date: 11/12/2017    History of Present Illness 52 year old mal with a past medical history significant for alcohol abusee and who has smoked 1 pack of cigarettes daily since his teenage years came to our facility from an outside hospital in the setting of a cavitary lesion in the right upper lobe. Pt being worked up for possible tuberculosis.   Clinical Impression   This 52 y/o male presents with the above. At baseline pt is independent with ADLs and functional mobility, typically lives alone but reports he stays with his sister when he is sick. Pt completing room level functional mobility without AD and overall minguard assist this session; demonstrates UB and LB ADLs with minguard assist. Pt presenting with overall generalized weakness and decreased activity tolerance. Will continue to follow while pt remains in acute setting to maximize his strength, safety and independence with ADLs and functional mobility prior to discharge home. Do not anticipate pt will require follow up OT services.     Follow Up Recommendations  No OT follow up;Supervision - Intermittent    Equipment Recommendations  None recommended by OT           Precautions / Restrictions Precautions Precautions: None Restrictions Weight Bearing Restrictions: No      Mobility Bed Mobility Overal bed mobility: Needs Assistance Bed Mobility: Supine to Sit     Supine to sit: Min guard     General bed mobility comments: for safety   Transfers Overall transfer level: Needs assistance Equipment used: None Transfers: Sit to/from Stand Sit to Stand: Supervision         General transfer comment: for safety     Balance Overall balance assessment: Mild deficits observed, not formally tested                                         ADL either performed or assessed with clinical judgement    ADL Overall ADL's : Needs assistance/impaired Eating/Feeding: Independent;Sitting   Grooming: Min guard;Standing   Upper Body Bathing: Supervision/ safety;Sitting   Lower Body Bathing: Min guard;Sit to/from stand   Upper Body Dressing : Sitting;Supervision/safety   Lower Body Dressing: Min guard;Sit to/from stand Lower Body Dressing Details (indicate cue type and reason): pt donning slippers seated on EOB, reaching towards feet to complete task  Toilet Transfer: Min guard;Ambulation;Regular Teacher, adult education Details (indicate cue type and reason): simulated in transfer to/from EOB  Toileting- Clothing Manipulation and Hygiene: Min guard;Sit to/from stand       Functional mobility during ADLs: Min guard(pushing IV pole ) General ADL Comments: pt completing room level functional mobility pushing IV pole, no significant LOB; pt appearing fatigued and with generalized weakness      Vision         Perception     Praxis      Pertinent Vitals/Pain Pain Assessment: No/denies pain     Hand Dominance     Extremity/Trunk Assessment Upper Extremity Assessment Upper Extremity Assessment: Generalized weakness   Lower Extremity Assessment Lower Extremity Assessment: Defer to PT evaluation   Cervical / Trunk Assessment Cervical / Trunk Assessment: Normal   Communication Communication Communication: No difficulties   Cognition Arousal/Alertness: Awake/alert Behavior During Therapy: WFL for tasks assessed/performed Overall Cognitive Status: Within Functional Limits for tasks assessed  General Comments       Exercises     Shoulder Instructions      Home Living Family/patient expects to be discharged to:: Private residence Living Arrangements: Alone Available Help at Discharge: Family;Available PRN/intermittently Type of Home: House Home Access: Stairs to enter Entergy CorporationEntrance Stairs-Number of Steps: 1   Home  Layout: One level     Bathroom Shower/Tub: Chief Strategy OfficerTub/shower unit   Bathroom Toilet: Standard     Home Equipment: None   Additional Comments: reports he stays with his sister when he is sick       Prior Functioning/Environment Level of Independence: Independent                 OT Problem List: Decreased strength;Decreased activity tolerance      OT Treatment/Interventions: Self-care/ADL training;DME and/or AE instruction;Therapeutic activities;Balance training;Therapeutic exercise;Patient/family education    OT Goals(Current goals can be found in the care plan section) Acute Rehab OT Goals Patient Stated Goal: regain strength, independence  OT Goal Formulation: With patient Time For Goal Achievement: 11/26/17 Potential to Achieve Goals: Good  OT Frequency: Min 2X/week   Barriers to D/C:            Co-evaluation              AM-PAC PT "6 Clicks" Daily Activity     Outcome Measure Help from another person eating meals?: None Help from another person taking care of personal grooming?: None Help from another person toileting, which includes using toliet, bedpan, or urinal?: None Help from another person bathing (including washing, rinsing, drying)?: A Little Help from another person to put on and taking off regular upper body clothing?: None Help from another person to put on and taking off regular lower body clothing?: A Little 6 Click Score: 22   End of Session Nurse Communication: Mobility status  Activity Tolerance: Patient tolerated treatment well;Patient limited by fatigue Patient left: with call bell/phone within reach;with family/visitor present;Other (comment)(sitting EOB with handoff to PT )  OT Visit Diagnosis: Muscle weakness (generalized) (M62.81)                Time: [id number] OT Time Calculation (min): 14 min Charges:  OT General Charges $OT Visit: 1 Visit OT Evaluation $OT Eval Moderate Complexity: 1 Mod G-Codes:     Marcy SirenBreanna Mellina Benison,  OT Pager [phone] 11/12/2017   Orlando PennerBreanna L [place] Downs 11/12/2017, 4:10 PM

## 2017-11-12 NOTE — Progress Notes (Signed)
Initial Nutrition Assessment  DOCUMENTATION CODES:   Not applicable  INTERVENTION:   -Continue MVI with minerals daily -Ensure Enlive po BID, each supplement provides 350 kcal and 20 grams of protein  NUTRITION DIAGNOSIS:   Increased nutrient needs related to acute illness as evidenced by estimated needs.  GOAL:   Patient will meet greater than or equal to 90% of their needs  MONITOR:   PO intake, Supplement acceptance, Labs, Weight trends, Skin, I & O's  REASON FOR ASSESSMENT:   Malnutrition Screening Tool, Consult COPD Protocol  ASSESSMENT:   Tanner Hayes is a 52 y.o. male with medical history significant of  COPD and ETOH abuse presenting as a transfer from [facility].  He reports, "I'm hurting."  He went to the hospital last Thursday with right-sided pain just inferior to his breast since Tuesday.  Much worse with coughing.  No change in cough freuquency, but he has been coughing up yellowish-brownish sptuum.  [facility] went to [facility] in [place].  He was hurting so much he couldn't sit still.  In [place], they did blood work and a non-contrast CT.  His WBC count was high and there was reported "liver damage".  The doctor was reportedly concerned about a bacterial infection.  He was kept overnight but he was not discharged with any antibiotics.  New meds: MVI, B1 BID, folate 1 mg; Coreg 3.125 mg BID; Chlordiazepoxide taper for ETOH withdrawal.  His sister has also been giving him Tylenol Severe Cough and Cold.  He has not gotten any better since discharge.  Last night, he was too weak to move and so his sister insisted that they go to [facility].    Pt admitted with cavitary lung mass, with potential for malignancy. Per PCCM note from 11/12/17: "The differential diagnosis here is broad.  I think the most likely explanation here is that he has been getting drunk frequently and falling asleep and aspirating as the posterior segment of the right upper  lobe is not an uncommon location for that in that scenario.  The differential diagnosis clearly includes tuberculosis given his jail history and clinical findings as well as malignancy.  I think a less likely consideration would be something fungal.". Pt was unable to produce sputum, so may require bronchoscopy.   Spoke with pt at bedside, who reports he was in his usual state of health up until about 3 days ago. He was recently discharged [facility] in [place] without improvement in symptoms. Pt shares that he has had no appetite over the past 3 days related to cough and chest pain. Pt did not eat much breakfast today, but reports he is not a breakfast eater at baseline. PTA pt was consuming 2 meals per day, which would consist of a meat and a side, such as pork chops or a burger. Pt shares he also consumes 6-12 beers daily. Per pt cousin at bedside, pt was consuming 2-4 Boost supplements daily PTA due to poor oral intake.   Pt endorses wt loss since illness; he estimates he has lost 10# within the past week. He reports UBW is around 130-135#. However, this is not consistent with wt hx. Noted per CareEverywhere pt wt has been stable around 127# over the past year.   Discussed with pt importance of good nutritional intake to promote healing. Pt amenable to try Ensure supplements is they are chocolate flavored.   Unable to identify malnutrition at this time, however, pt is at high risk  given acute illness and ETOH abuse.   Labs reviewed.   NUTRITION - FOCUSED PHYSICAL EXAM:    Most Recent Value  Orbital Region  Mild depletion  Upper Arm Region  No depletion  Thoracic and Lumbar Region  Mild depletion  Buccal Region  No depletion  Temple Region  No depletion  Clavicle Bone Region  No depletion  Clavicle and Acromion Bone Region  No depletion  Scapular Bone Region  No depletion  Dorsal Hand  Mild depletion  Patellar Region  Moderate depletion  Anterior Thigh Region  Moderate  depletion  Posterior Calf Region  Moderate depletion  Edema (RD Assessment)  None  Hair  Reviewed  Eyes  Reviewed  Mouth  Reviewed  Skin  Reviewed  Nails  Reviewed       Diet Order:   Diet Order           Diet regular Room service appropriate? Yes; Fluid consistency: Thin  Diet effective now          EDUCATION NEEDS:   Education needs have been addressed  Skin:  Skin Assessment: Reviewed RN Assessment  Last BM:  11/11/17  Height:   Ht Readings from Last 1 Encounters:  11/11/17 5\' 3"  (1.6 m)    Weight:   Wt Readings from Last 1 Encounters:  11/11/17 125 lb 7.1 oz (56.9 kg)    Ideal Body Weight:  56.4 kg  BMI:  Body mass index is 22.22 kg/m.  Estimated Nutritional Needs:   Kcal:  1700-1900  Protein:  105-120 grams  Fluid:  1.7-1.9 L    Kaiyan Luczak A. Mayford Knife, RD, LDN, CDE Pager: [phone] After hours Pager: [phone]

## 2017-11-13 DIAGNOSIS — E43 Unspecified severe protein-calorie malnutrition: Secondary | ICD-10-CM | POA: Diagnosis present

## 2017-11-13 LAB — CBC
HEMATOCRIT: 34.7 % — AB (ref 39.0–52.0)
HEMOGLOBIN: 11.3 g/dL — AB (ref 13.0–17.0)
MCH: 31 pg (ref 26.0–34.0)
MCHC: 32.6 g/dL (ref 30.0–36.0)
MCV: 95.3 fL (ref 78.0–100.0)
Platelets: 692 10*3/uL — ABNORMAL HIGH (ref 150–400)
RBC: 3.64 MIL/uL — ABNORMAL LOW (ref 4.22–5.81)
RDW: 14.3 % (ref 11.5–15.5)
WBC: 10.9 10*3/uL — ABNORMAL HIGH (ref 4.0–10.5)

## 2017-11-13 LAB — COMPREHENSIVE METABOLIC PANEL
ALK PHOS: 48 U/L (ref 38–126)
ALT: 34 U/L (ref 17–63)
ANION GAP: 8 (ref 5–15)
AST: 22 U/L (ref 15–41)
Albumin: 2.7 g/dL — ABNORMAL LOW (ref 3.5–5.0)
BILIRUBIN TOTAL: 0.1 mg/dL — AB (ref 0.3–1.2)
BUN: 10 mg/dL (ref 6–20)
CALCIUM: 9.2 mg/dL (ref 8.9–10.3)
CO2: 28 mmol/L (ref 22–32)
Chloride: 102 mmol/L (ref 101–111)
Creatinine, Ser: 0.68 mg/dL (ref 0.61–1.24)
GFR calc non Af Amer: >60 mL/min (ref >60)
Glucose, Bld: 117 mg/dL — ABNORMAL HIGH (ref 65–99)
Potassium: 4.2 mmol/L (ref 3.5–5.1)
Sodium: 138 mmol/L (ref 135–145)
TOTAL PROTEIN: 6.5 g/dL (ref 6.5–8.1)

## 2017-11-13 LAB — ACID FAST SMEAR (AFB, MYCOBACTERIA): Acid Fast Smear: NEGATIVE

## 2017-11-13 MED ORDER — PRO-STAT SUGAR FREE PO LIQD
30.0000 mL | Freq: Two times a day (BID) | ORAL | Status: DC
  Administered 2017-11-13 – 2017-11-18 (×10): 30 mL via ORAL
  Filled 2017-11-13 (×11): qty 30

## 2017-11-13 NOTE — Progress Notes (Signed)
Patient ID: Tanner Hayes, male   DOB: 1966-02-26, 52 y.o.   MRN: [id number]                                                                PROGRESS NOTE                                                                                                                                                                                                             Patient Demographics:    Tanner Hayes, is a 52 y.o. male, DOB - 11-14-1965, ZTI:[id number]  Admit date - 11/11/2017   Admitting Physician Rise Patience, MD  Outpatient Primary MD for the patient is Default, Provider, MD  LOS - 2    ASN:[id number] [facility], [place], [place] - Dr. Glennon Mac Consultants:None Patient coming from: Home - lives alone; XTK:[id number], [phone]     Outpatient Specialists:     No chief complaint on file.      Brief Narrative   52 y.o.malewith medical history significant ofCOPD and ETOH abuse presenting as a transfer from [facility]. He reports, "I'm hurting." He went to the hospital last Thursday with right-sided pain just inferior to his breast since Tuesday. Much worse with coughing. No change in cough freuquency, but he has been coughing up yellowish-brownish sptuum. They went to [facility] in [place]. He was hurting so much he couldn't sit still. In [place], they did blood work and a non-contrast CT. His WBC count was high and there was reported "liver damage".The doctor was reportedlyconcerned about a bacterial infection. He was kept overnight but he was not discharged with any antibiotics. New meds: MVI, B1 BID, folate 1 mg; Coreg 3.125 mg BID; Chlordiazepoxide taper for ETOH withdrawal. His sister has also been giving him Tylenol Severe Cough and Cold. He has not gotten any better since discharge. Last night, he was too weak to move and so his sister insisted that they go to [facility].   Current symptoms: SOB - has chronic asthma and  bronchitis, unchanged from usual. Ongoing chest pain but better tan it was. Cough as above. He had emesis last week but not this week. Generalized weaknes. His sister reports that he is confused. +weight loss, reports anorexia last week. Yesterday, he ate a cheeseburger Happy Meal and drank  4 boosts. No night sweats.  D/C summary from [facility], 6/14: -atypical chest pain, ruled out MI -chronic alcoholism -protein calorie malnutrition moderate -hyponatremia (Na++ 128) secondary to beer potomania -COPD by history -elevated transaminases secondary to alcohol (AST 147, ALT 143)  WBC count 20,000, no focus of infection identified. Given Rocephin x 1. Improving LFTs, likely elevated from ETOH. Placed on a librium taper for ETOH withdrawal. Hepatitis panel negative. UDS negative. CRP 22.0 ESR 20  ER doctor note from [facility] today reports "CT did show what appears to be a large 10 x 4.8 x 4.2 cm cavitary mass in the right upper lobe extending into the right hilum with what looks to be evidence of invasion into the right posterior superior mediastinum... 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 Hal Hope, patient "was recently admitted for chest pain rule out. CT angio chest showed 10 cm lung mass invading the mediastinum. No beds in other hospital and due to persistent pain and invading mass accepted for further management. Labs as per EDP nothing abnormal."      Subjective:    Tanner Hayes today has been afebrile.  AFB negative. Fungal culture pending. Slight cough.  Denies cp, palp, sob, n/v, diarrhea, brbpr.    No headache, No abdominal pain - No new weakness tingling or numbness   Assessment  & Plan :    Active Problems:   Cavitary lesion of lung   COPD with chronic bronchitis (HCC)   Tobacco dependence   Alcohol dependence ([place])     Cavitary RUL lesion in  smoker with poor dentition and jail history and smoker Continue Unasyn (covers klebsiella) Sputum culture Sputum AFB x3 (1st negative) Sputum fungus culture QuantiFERON gold Airborne precautions Urine histo antigen Blood culture 6/20->negative If these tests are negative then bronchoscopy possibly next week  Copd Cont symbicort Cont Duoneb prn  ETOH dep CIWA  Nicotine dep Nicotine patch prn   Anemia Check cbc in am  Severe protein calorie malnutrition Cont ensure Start prostat 49m po bid   Code Status :  FULL CODE  Family Communication  : w patient  Disposition Plan  :  home  Barriers For Discharge :   Consults  :  Pulmonary   Procedures  :    DVT Prophylaxis  :  Lovenox  SCDs    Lab Results  Component Value Date   PLT 692 (H) 11/13/2017    Antibiotics  :  unasyn  Anti-infectives (From admission, onward)   Start     Dose/Rate Route Frequency Ordered Stop   11/11/17 1500  Ampicillin-Sulbactam (UNASYN) 3 g in sodium chloride 0.9 % 100 mL IVPB     3 g 200 mL/hr over 30 Minutes Intravenous Every 6 hours 11/11/17 1422          Objective:   Vitals:   11/12/17 1335 11/12/17 1953 11/12/17 2102 11/13/17 0440  BP: 107/75  111/78 108/69  Pulse: 93  90 90  Resp: 16  19 19   Temp: 99 F (37.2 C)  98.2 F (36.8 C) 98.2 F (36.8 C)  TempSrc: Oral  Oral Oral  SpO2: 96% 96% 100% 100%  Weight:      Height:        Wt Readings from Last 3 Encounters:  11/11/17 56.9 kg (125 lb 7.1 oz)     Intake/Output Summary (Last 24 hours) at 11/13/2017 1101 Last data filed at 11/13/2017  0624 Gross per 24 hour  Intake 2263.92 ml  Output 325 ml  Net 1938.92 ml     Physical Exam  Awake Alert, Oriented X 3, No new F.N deficits, Normal affect [place].AT,PERRAL Supple Neck,No JVD, No cervical lymphadenopathy appriciated.  Symmetrical Chest wall movement, Good air movement bilaterally, CTAB RRR,No Gallops,Rubs or new Murmurs, No Parasternal Heave +ve  B.Sounds, Abd Soft, No tenderness, No organomegaly appriciated, No rebound - guarding or rigidity. No Cyanosis, Clubbing or edema, No new Rash or bruise        Data Review:    CBC Recent Labs  Lab 11/12/17 0709 11/13/17 0449  WBC 12.3* 10.9*  HGB 11.8* 11.3*  HCT 35.9* 34.7*  PLT 653* 692*  MCV 93.7 95.3  MCH 30.8 31.0  MCHC 32.9 32.6  RDW 14.3 14.3  LYMPHSABS 1.8  --   MONOABS 2.0*  --   EOSABS 0.3  --   BASOSABS 0.1  --     Chemistries  Recent Labs  Lab 11/12/17 0709 11/13/17 0449  NA 135 138  K 4.2 4.2  CL 101 102  CO2 28 28  GLUCOSE 100* 117*  BUN 6 10  CREATININE 0.60* 0.68  CALCIUM 9.1 9.2  AST  --  22  ALT  --  34  ALKPHOS  --  48  BILITOT  --  0.1*   ------------------------------------------------------------------------------------------------------------------ No results for input(s): CHOL, HDL, LDLCALC, TRIG, CHOLHDL, LDLDIRECT in the last 72 hours.  No results found for: HGBA1C ------------------------------------------------------------------------------------------------------------------ No results for input(s): TSH, T4TOTAL, T3FREE, THYROIDAB in the last 72 hours.  Invalid input(s): FREET3 ------------------------------------------------------------------------------------------------------------------ No results for input(s): VITAMINB12, FOLATE, FERRITIN, TIBC, IRON, RETICCTPCT in the last 72 hours.  Coagulation profile No results for input(s): INR, PROTIME in the last 168 hours.  No results for input(s): DDIMER in the last 72 hours.  Cardiac Enzymes No results for input(s): CKMB, TROPONINI, MYOGLOBIN in the last 168 hours.  Invalid input(s): CK ------------------------------------------------------------------------------------------------------------------ No results found for: BNP  Inpatient Medications  Scheduled Meds: . carvedilol  3.125 mg Oral BID WC  . feeding supplement (ENSURE ENLIVE)  237 mL Oral BID BM  . folic acid   1 mg Oral Daily  . mometasone-formoterol  2 puff Inhalation BID  . multivitamin with minerals  1 tablet Oral Daily  . nicotine  14 mg Transdermal Daily  . sodium chloride HYPERTONIC  4 mL Nebulization Daily  . thiamine  50 mg Oral BID   Continuous Infusions: . sodium chloride 75 mL/hr at 11/13/17 1026  . ampicillin-sulbactam (UNASYN) IV 3 g (11/13/17 0822)   PRN Meds:.ipratropium-albuterol, LORazepam **OR** LORazepam, oxyCODONE  Micro Results Recent Results (from the past 240 hour(s))  MRSA PCR Screening     Status: None   Collection Time: 11/11/17  1:28 PM  Result Value Ref Range Status   MRSA by PCR NEGATIVE NEGATIVE Final    Comment:        The GeneXpert MRSA Assay (FDA approved for NASAL specimens only), is one component of a comprehensive MRSA colonization surveillance program. It is not intended to diagnose MRSA infection nor to guide or monitor treatment for MRSA infections. Performed at [facility], [place] [address], [place], [place] [address]   Culture, blood (routine x 2) Call MD if unable to obtain prior to antibiotics being given     Status: None (Preliminary result)   Collection Time: 11/11/17  3:16 PM  Result Value Ref Range Status   Specimen Description BLOOD LEFT ANTECUBITAL  Final   Special Requests   Final    BOTTLES DRAWN AEROBIC AND ANAEROBIC Blood Culture adequate volume   Culture   Final    NO GROWTH < 24 HOURS Performed at [facility], [address] [place], [place] [address]    Report Status PENDING  Incomplete  Culture, blood (routine x 2) Call MD if unable to obtain prior to antibiotics being given     Status: None (Preliminary result)   Collection Time: 11/11/17  3:22 PM  Result Value Ref Range Status   Specimen Description BLOOD RIGHT ANTECUBITAL  Final   Special Requests   Final    BOTTLES DRAWN AEROBIC AND ANAEROBIC Blood Culture adequate volume   Culture   Final    NO GROWTH < 24 HOURS Performed at [facility], [address], [place], [place] [address]    Report Status PENDING  Incomplete  Culture, expectorated sputum-assessment     Status: None   Collection Time: 11/12/17  2:21 PM  Result Value Ref Range Status   Specimen Description SPUTUM  Final   Special Requests NONE  Final   Sputum evaluation   Final    Sputum specimen not acceptable for testing.  Please recollect.   Gram Stain Report Called to,Read Back By and Verified With: RN Norlene Campbell [id number] Performed at [facility], [place] [address] [place], [place] [address]    Report Status 11/12/2017 FINAL  Final  Acid Fast Smear (AFB)     Status: None   Collection Time: 11/12/17  2:21 PM  Result Value Ref Range Status   AFB Specimen Processing Concentration  Final   Acid Fast Smear Negative  Final    Comment: (NOTE) Performed At: [facility] [place], [place] [id number] Rush Farmer MD JL:[id number]    Source (AFB) SPUTUM  Final    Comment: Performed at [facility], [place] [address] [place], [place] [address]    Radiology Reports No results found.  Time Spent in minutes  30   Jani Gravel M.D on 11/13/2017 at 11:01 AM  Between 7am to 7pm - Pager - [phone]  After 7pm go to [email or web address] - password [facility]  [facility] -  Office  [phone]

## 2017-11-13 NOTE — Progress Notes (Signed)
Noted a child was visiting patient , educated family about putting a mask while in the patient room because we are still ruling out TB. Family is aware about the danger of exposure.

## 2017-11-14 DIAGNOSIS — E43 Unspecified severe protein-calorie malnutrition: Secondary | ICD-10-CM

## 2017-11-14 DIAGNOSIS — F1020 Alcohol dependence, uncomplicated: Secondary | ICD-10-CM

## 2017-11-14 LAB — COMPREHENSIVE METABOLIC PANEL
ALBUMIN: 3.1 g/dL — AB (ref 3.5–5.0)
ALK PHOS: 48 U/L (ref 38–126)
ALT: 37 U/L (ref 17–63)
AST: 23 U/L (ref 15–41)
Anion gap: 7 (ref 5–15)
BUN: 11 mg/dL (ref 6–20)
CALCIUM: 9.6 mg/dL (ref 8.9–10.3)
CHLORIDE: 100 mmol/L — AB (ref 101–111)
CO2: 30 mmol/L (ref 22–32)
CREATININE: 0.54 mg/dL — AB (ref 0.61–1.24)
GFR calc non Af Amer: >60 mL/min (ref >60)
GLUCOSE: 106 mg/dL — AB (ref 65–99)
Potassium: 4.4 mmol/L (ref 3.5–5.1)
SODIUM: 137 mmol/L (ref 135–145)
Total Bilirubin: 0.2 mg/dL — ABNORMAL LOW (ref 0.3–1.2)
Total Protein: 7.3 g/dL (ref 6.5–8.1)

## 2017-11-14 LAB — CBC
HCT: 38.2 % — ABNORMAL LOW (ref 39.0–52.0)
Hemoglobin: 12.3 g/dL — ABNORMAL LOW (ref 13.0–17.0)
MCH: 30.8 pg (ref 26.0–34.0)
MCHC: 32.2 g/dL (ref 30.0–36.0)
MCV: 95.5 fL (ref 78.0–100.0)
PLATELETS: 834 10*3/uL — AB (ref 150–400)
RBC: 4 MIL/uL — AB (ref 4.22–5.81)
RDW: 14.3 % (ref 11.5–15.5)
WBC: 12.7 10*3/uL — AB (ref 4.0–10.5)

## 2017-11-14 LAB — HISTOPLASMA ANTIGEN, URINE: Histoplasma Antigen, urine: <0.5 (ref <0.5)

## 2017-11-14 NOTE — Progress Notes (Signed)
[facility]                                                                              Patient Demographics  Tanner Hayes, is a 52 y.o. male, DOB - 03/16/1966, ZOX:[id number]  Admit date - 11/11/2017   Admitting Physician Eduard Clos, MD  Outpatient Primary MD for the patient is Default, Provider, MD  Outpatient specialists:   LOS - 3  days   Medical records reviewed and are as summarized below:    No chief complaint on file.      Brief summary   52 y.o.malewith medical history significant ofCOPD and ETOH abuse presenting as a transfer from [employer]. He went to the hospital a week ago with sided pain just inferior to his breast since Tuesday. Much worse with coughing, been coughing up yellowish-brownish sputum. They went to [facility] in [place], patient had a noncontrast CT and was found to have leukocytosis and question of liver damage.  Patient was discharged the next day.  CT scan at [facility] showed a 10x 4.8x 0.2 cm cavitary mass in the right upper lobe extending into the right hilum with what looks to be evidence of invasion into the right posterior superior mediastinum".  Patient was admitted for further work-up.   Assessment & Plan    Principal Problem: Right upper lobe cavitary pneumonia -Currently afebrile, slight cough no fevers or chills -Pulmonology was consulted, AFB sputum x1-, fungal cultures negative so far, QuantiFERON-TB pending.  Blood cultures negative so far -Pulmonology consulted, will follow recommendations. -Continue IV Unasyn   Active Problems:   COPD with chronic bronchitis (HCC) -Currently stable, continue duo nebs as needed, Symbicort     Tobacco dependence -Counseled strongly on smoking cessation, nicotine patch    Alcohol dependence (HCC) -Continue CIWA with Ativan, thiamine, folate    Severe protein-calorie malnutrition (HCC) -Continue nutritional supplements  Code Status:  Full code DVT Prophylaxis: SCDs Family Communication: Discussed in detail with the patient, all imaging results, lab results explained to the patient and sister at the bedside   Disposition Plan: Once cleared by pulmonology  Time Spent in minutes   25 minutes  Procedures:  None  Consultants:   Pulmonology  Antimicrobials:      Medications  Scheduled Meds: . carvedilol  3.125 mg Oral BID WC  . feeding supplement (ENSURE ENLIVE)  237 mL Oral BID BM  . feeding supplement (PRO-STAT SUGAR FREE 64)  30 mL Oral BID  . folic acid  1 mg Oral Daily  . mometasone-formoterol  2 puff Inhalation BID  . multivitamin with minerals  1 tablet Oral Daily  . nicotine  14 mg Transdermal Daily  . thiamine  50 mg Oral BID   Continuous Infusions: . ampicillin-sulbactam (UNASYN) IV 3 g (11/14/17 0401)   PRN Meds:.ipratropium-albuterol, LORazepam **OR** LORazepam, oxyCODONE   Antibiotics   Anti-infectives (From admission, onward)   Start     Dose/Rate Route Frequency Ordered Stop   11/11/17 1500  Ampicillin-Sulbactam (UNASYN) 3 g in sodium chloride 0.9 % 100 mL IVPB     3 g 200 mL/hr over 30 Minutes Intravenous  Every 6 hours 11/11/17 1422          Subjective:   Tanner Hayes was seen and examined today.  Slight cough but no fevers or chills.  No significant shortness of breath.  Patient denies dizziness, chest pain, abdominal pain, N/V/D/C, new weakness, numbess, tingling. No acute events overnight.    Objective:   Vitals:   11/13/17 2113 11/13/17 2224 11/14/17 0606 11/14/17 0915  BP:  112/72 111/75   Pulse:  95 81   Resp:  16 17   Temp:  97.9 F (36.6 C) 98.2 F (36.8 C)   TempSrc:  Oral Oral   SpO2: 97% 100% 100% 98%  Weight:      Height:        Intake/Output Summary (Last 24 hours) at 11/14/2017 1207 Last data filed at 11/14/2017 1610 Gross per 24 hour  Intake 2056.23 ml  Output -  Net 2056.23 ml     Wt Readings from Last 3 Encounters:  11/11/17 56.9 kg (125  lb 7.1 oz)     Exam  General: Alert and oriented x 3, NAD  Eyes:   HEENT:   Cardiovascular: S1 S2 auscultated, Regular rate and rhythm.  Respiratory: Bilateral rhonchi, right worse than left  Gastrointestinal: Soft, nontender, nondistended, + bowel sounds  Ext: no pedal edema bilaterally  Neuro: no new deficits  Musculoskeletal: No digital cyanosis, clubbing  Skin: No rashes  Psych: Normal affect and demeanor, alert and oriented x3    Data Reviewed:  I have personally reviewed following labs and imaging studies  Micro Results Recent Results (from the past 240 hour(s))  MRSA PCR Screening     Status: None   Collection Time: 11/11/17  1:28 PM  Result Value Ref Range Status   MRSA by PCR NEGATIVE NEGATIVE Final    Comment:        The GeneXpert MRSA Assay (FDA approved for NASAL specimens only), is one component of a comprehensive MRSA colonization surveillance program. It is not intended to diagnose MRSA infection nor to guide or monitor treatment for MRSA infections. Performed at [facility], [address], [place], [place] [address]   Culture, blood (routine x 2) Call MD if unable to obtain prior to antibiotics being given     Status: None (Preliminary result)   Collection Time: 11/11/17  3:16 PM  Result Value Ref Range Status   Specimen Description BLOOD LEFT ANTECUBITAL  Final   Special Requests   Final    BOTTLES DRAWN AEROBIC AND ANAEROBIC Blood Culture adequate volume   Culture   Final    NO GROWTH 2 DAYS Performed at [facility], [address] [place], [place] [address]    Report Status PENDING  Incomplete  Culture, blood (routine x 2) Call MD if unable to obtain prior to antibiotics being given     Status: None (Preliminary result)   Collection Time: 11/11/17  3:22 PM  Result Value Ref Range Status   Specimen Description BLOOD RIGHT ANTECUBITAL  Final   Special Requests   Final    BOTTLES DRAWN AEROBIC AND ANAEROBIC Blood Culture  adequate volume   Culture   Final    NO GROWTH 2 DAYS Performed at [facility], [address], [place], [place] [address]    Report Status PENDING  Incomplete  Culture, expectorated sputum-assessment     Status: None   Collection Time: 11/12/17  2:21 PM  Result Value Ref Range Status  Specimen Description SPUTUM  Final   Special Requests NONE  Final   Sputum evaluation   Final    Sputum specimen not acceptable for testing.  Please recollect.   Gram Stain Report Called to,Read Back By and Verified With: RN Cecil CrankerZ CALWITAN [phone] MLM Performed at [facility], [address] [place], [place] [address]    Report Status 11/12/2017 FINAL  Final  Acid Fast Smear (AFB)     Status: None   Collection Time: 11/12/17  2:21 PM  Result Value Ref Range Status   AFB Specimen Processing Concentration  Final   Acid Fast Smear Negative  Final    Comment: (NOTE) Performed At: [facility] [address] [place], [place] [id number] Jolene SchimkeNagendra Sanjai MD GN:[id number]    Source (AFB) SPUTUM  Final    Comment: Performed at [facility], [address] [place], [place] [address]    Radiology Reports No results found.  Lab Data:  CBC: Recent Labs  Lab 11/12/17 0709 11/13/17 0449 11/14/17 1052  WBC 12.3* 10.9* 12.7*  NEUTROABS 7.7  --   --   HGB 11.8* 11.3* 12.3*  HCT 35.9* 34.7* 38.2*  MCV 93.7 95.3 95.5  PLT 653* 692* 834*   Basic Metabolic Panel: Recent Labs  Lab 11/12/17 0709 11/13/17 0449 11/14/17 1052  NA 135 138 137  K 4.2 4.2 4.4  CL 101 102 100*  CO2 28 28 30   GLUCOSE 100* 117* 106*  BUN 6 10 11   CREATININE 0.60* 0.68 0.54*  CALCIUM 9.1 9.2 9.6   GFR: Estimated Creatinine Clearance: 85.9 mL/min (A) (by C-G formula based on SCr of 0.54 mg/dL (L)). Liver Function Tests: Recent Labs  Lab 11/13/17 0449 11/14/17 1052  AST 22 23  ALT 34 37  ALKPHOS 48 48  BILITOT 0.1* 0.2*  PROT 6.5 7.3  ALBUMIN 2.7* 3.1*   No results for input(s):  LIPASE, AMYLASE in the last 168 hours. No results for input(s): AMMONIA in the last 168 hours. Coagulation Profile: No results for input(s): INR, PROTIME in the last 168 hours. Cardiac Enzymes: No results for input(s): CKTOTAL, CKMB, CKMBINDEX, TROPONINI in the last 168 hours. BNP (last 3 results) No results for input(s): PROBNP in the last 8760 hours. HbA1C: No results for input(s): HGBA1C in the last 72 hours. CBG: No results for input(s): GLUCAP in the last 168 hours. Lipid Profile: No results for input(s): CHOL, HDL, LDLCALC, TRIG, CHOLHDL, LDLDIRECT in the last 72 hours. Thyroid Function Tests: No results for input(s): TSH, T4TOTAL, FREET4, T3FREE, THYROIDAB in the last 72 hours. Anemia Panel: No results for input(s): VITAMINB12, FOLATE, FERRITIN, TIBC, IRON, RETICCTPCT in the last 72 hours. Urine analysis: No results found for: COLORURINE, APPEARANCEUR, LABSPEC, PHURINE, GLUCOSEU, HGBUR, BILIRUBINUR, KETONESUR, PROTEINUR, UROBILINOGEN, NITRITE, LEUKOCYTESUR   Ripudeep Rai M.D. [facility] 11/14/2017, 12:07 PM  Pager: [phone] Between 7am to 7pm - call Pager - [phone]  After 7pm go to [email or web address] - password [email or web address]  Call night coverage person covering after 7pm

## 2017-11-14 NOTE — Progress Notes (Signed)
Pharmacy Antibiotic Note  Tanner Hayes is a 52 y.o. male admitted on 11/11/2017 with lung mass on CT.  Pharmacy has been consulted for Unasyn dosing for possible aspiration cavitary pneumonia. Today is 4 of antibiotics. Renal function is stable.  6/13 Blood cx ([facility]) >> ngtd 6/20 Blood cx ([facility]) >> ngtd 6/20 MRSA pcr >> neg 6/20 HIV neg 6/20 Quantiferon-TB: pending 6/21 Sputum: recollect 6/21 AFB neg  Plan: Continue Unasyn 3 g IV every 6 hours Pharmacy signing off but will use decision support software for dosing changes as needed  Height: 5\' 3"  (160 cm) Weight: 125 lb 7.1 oz (56.9 kg) IBW/kg (Calculated) : 56.9  Temp (24hrs), Avg:98 F (36.7 C), Min:97.9 F (36.6 C), Max:98.2 F (36.8 C)  Recent Labs  Lab 11/12/17 0709 11/13/17 0449  WBC 12.3* 10.9*  CREATININE 0.60* 0.68    Estimated Creatinine Clearance: 85.9 mL/min (by C-G formula based on SCr of 0.68 mg/dL).    No Known Allergies   Thank you for allowing pharmacy to be a part of this patient's care.  Loura BackJennifer Bath, PharmD, BCPS Clinical Pharmacist Clinical phone for 11/14/2017 until 3p is [phone] Please check AMION for all Pharmacist numbers by unit 11/14/2017 10:57 AM

## 2017-11-15 ENCOUNTER — Inpatient Hospital Stay (HOSPITAL_COMMUNITY): Payer: Self-pay

## 2017-11-15 DIAGNOSIS — R911 Solitary pulmonary nodule: Secondary | ICD-10-CM

## 2017-11-15 LAB — BASIC METABOLIC PANEL
ANION GAP: 9 (ref 5–15)
BUN: 12 mg/dL (ref 6–20)
CHLORIDE: 101 mmol/L (ref 101–111)
CO2: 28 mmol/L (ref 22–32)
Calcium: 9.4 mg/dL (ref 8.9–10.3)
Creatinine, Ser: 0.6 mg/dL — ABNORMAL LOW (ref 0.61–1.24)
GFR calc Af Amer: >60 mL/min (ref >60)
GFR calc non Af Amer: >60 mL/min (ref >60)
GLUCOSE: 91 mg/dL (ref 65–99)
POTASSIUM: 4 mmol/L (ref 3.5–5.1)
Sodium: 138 mmol/L (ref 135–145)

## 2017-11-15 LAB — CBC
HEMATOCRIT: 34.2 % — AB (ref 39.0–52.0)
HEMOGLOBIN: 11.3 g/dL — AB (ref 13.0–17.0)
MCH: 31.2 pg (ref 26.0–34.0)
MCHC: 33 g/dL (ref 30.0–36.0)
MCV: 94.5 fL (ref 78.0–100.0)
PLATELETS: 770 10*3/uL — AB (ref 150–400)
RBC: 3.62 MIL/uL — AB (ref 4.22–5.81)
RDW: 14.2 % (ref 11.5–15.5)
WBC: 12.6 10*3/uL — AB (ref 4.0–10.5)

## 2017-11-15 MED ORDER — SODIUM CHLORIDE 0.9 % IV SOLN: INTRAVENOUS | Status: DC

## 2017-11-15 NOTE — Progress Notes (Signed)
[facility]                                                                              Patient Demographics  Tanner Hayes, is a 52 y.o. male, DOB - 14-Oct-1965, ZOX:[id number]  Admit date - 11/11/2017   Admitting Physician Eduard Clos, MD  Outpatient Primary MD for the patient is Default, Provider, MD  Outpatient specialists:   LOS - 4  days   Medical records reviewed and are as summarized below:    No chief complaint on file.      Brief summary   52 y.o.malewith medical history significant ofCOPD and ETOH abuse presenting as a transfer from [employer]. He went to the hospital a week ago with sided pain just inferior to his breast since Tuesday. Much worse with coughing, been coughing up yellowish-brownish sputum. They went to [facility] in [place], patient had a noncontrast CT and was found to have leukocytosis and question of liver damage.  Patient was discharged the next day.  CT scan at [facility] showed a 10x 4.8x 0.2 cm cavitary mass in the right upper lobe extending into the right hilum with what looks to be evidence of invasion into the right posterior superior mediastinum".  Patient was admitted for further work-up.   Assessment & Plan    Principal Problem: Right upper lobe cavitary pneumonia -Currently no fevers or chills, afebrile, feeling better -Pulmonology was consulted, AFB sputum x1-, fungal cultures negative so far -Quantiferon TB still in process.  Blood cultures negative so far  -Continue IV Unasyn, plan for bronchoscopy on 6/25, n.p.o. Tomorrow -Repeat chest x-ray today showed vague nodular density in the right lung apex  Active Problems:   COPD with chronic bronchitis (HCC) -Currently stable, no wheezing, continue duo nebs as needed, Symbicort      Tobacco dependence -Counseled strongly on smoking cessation, nicotine patch    Alcohol dependence (HCC) -Continue CIWA with Ativan, thiamine,  folate, currently no withdrawal    Severe protein-calorie malnutrition (HCC) -Continue nutritional supplements  Code Status: Full code DVT Prophylaxis: SCDs Family Communication: Discussed in detail with the patient, all imaging results, lab results explained to the patient and sister at the bedside   Disposition Plan: Once cleared by pulmonology  Time Spent in minutes   25 minutes  Procedures:  None  Consultants:   Pulmonology  Antimicrobials:      Medications  Scheduled Meds: . carvedilol  3.125 mg Oral BID WC  . feeding supplement (ENSURE ENLIVE)  237 mL Oral BID BM  . feeding supplement (PRO-STAT SUGAR FREE 64)  30 mL Oral BID  . folic acid  1 mg Oral Daily  . mometasone-formoterol  2 puff Inhalation BID  . multivitamin with minerals  1 tablet Oral Daily  . nicotine  14 mg Transdermal Daily  . thiamine  50 mg Oral BID   Continuous Infusions: . sodium chloride 10 mL/hr at 11/15/17 1504  . ampicillin-sulbactam (UNASYN) IV 3 g (11/15/17 1503)   PRN Meds:.ipratropium-albuterol, oxyCODONE   Antibiotics   Anti-infectives (From admission, onward)   Start     Dose/Rate Route Frequency Ordered  Stop   11/11/17 1500  Ampicillin-Sulbactam (UNASYN) 3 g in sodium chloride 0.9 % 100 mL IVPB     3 g 200 mL/hr over 30 Minutes Intravenous Every 6 hours 11/11/17 1422          Subjective:   Garnett Frohlich was seen and examined today.  Feeling slightly better, afebrile, slight cough.  Eager to go home soon.  No significant shortness of breath.   Patient denies dizziness, chest pain, abdominal pain, N/V/D/C, new weakness, numbess, tingling. No acute events overnight.    Objective:   Vitals:   11/14/17 1500 11/14/17 2018 11/15/17 0432 11/15/17 0920  BP: 127/77  123/76   Pulse: 87  80   Resp:   17   Temp: 98.6 F (37 C)  97.9 F (36.6 C)   TempSrc: Oral  Oral   SpO2: 100% 98% 100% 100%  Weight:      Height:        Intake/Output Summary (Last 24 hours) at  11/15/2017 1520 Last data filed at 11/15/2017 1400 Gross per 24 hour  Intake 1105 ml  Output -  Net 1105 ml     Wt Readings from Last 3 Encounters:  11/11/17 56.9 kg (125 lb 7.1 oz)     Exam   General: Alert and oriented x 3, NAD  Eyes:   HEENT  Cardiovascular: S1 S2 auscultated Regular rate and rhythm. No pedal edema b/l  Respiratory: Decreased breath sound at the bases, no wheezing  Gastrointestinal: Soft, nontender, nondistended, + bowel sounds  Ext: no pedal edema bilaterally  Neuro: no new deficit   musculoskeletal: No digital cyanosis, clubbing  Skin: No rashes  Psych: Normal affect and demeanor, alert and oriented x3     Data Reviewed:  I have personally reviewed following labs and imaging studies  Micro Results Recent Results (from the past 240 hour(s))  MRSA PCR Screening     Status: None   Collection Time: 11/11/17  1:28 PM  Result Value Ref Range Status   MRSA by PCR NEGATIVE NEGATIVE Final    Comment:        The GeneXpert MRSA Assay (FDA approved for NASAL specimens only), is one component of a comprehensive MRSA colonization surveillance program. It is not intended to diagnose MRSA infection nor to guide or monitor treatment for MRSA infections. Performed at [facility], [address] [place], [place] [address]   Culture, blood (routine x 2) Call MD if unable to obtain prior to antibiotics being given     Status: None (Preliminary result)   Collection Time: 11/11/17  3:16 PM  Result Value Ref Range Status   Specimen Description BLOOD LEFT ANTECUBITAL  Final   Special Requests   Final    BOTTLES DRAWN AEROBIC AND ANAEROBIC Blood Culture adequate volume   Culture   Final    NO GROWTH 4 DAYS Performed at [facility], [address] [place], [place] [address]    Report Status PENDING  Incomplete  Culture, blood (routine x 2) Call MD if unable to obtain prior to antibiotics being given     Status: None (Preliminary result)    Collection Time: 11/11/17  3:22 PM  Result Value Ref Range Status   Specimen Description BLOOD RIGHT ANTECUBITAL  Final   Special Requests   Final    BOTTLES DRAWN AEROBIC AND ANAEROBIC Blood Culture adequate volume   Culture   Final    NO GROWTH 4 DAYS Performed at [facility], [address] [place], [place] [address]    Report Status PENDING  Incomplete  Culture, expectorated sputum-assessment     Status: None   Collection Time: 11/12/17  2:21 PM  Result Value Ref Range Status   Specimen Description SPUTUM  Final   Special Requests NONE  Final   Sputum evaluation   Final    Sputum specimen not acceptable for testing.  Please recollect.   Gram Stain Report Called to,Read Back By and Verified With: RN Cecil Cranker [phone] MLM Performed at [facility], [address] [place], [place] [address]    Report Status 11/12/2017 FINAL  Final  Acid Fast Smear (AFB)     Status: None   Collection Time: 11/12/17  2:21 PM  Result Value Ref Range Status   AFB Specimen Processing Concentration  Final   Acid Fast Smear Negative  Final    Comment: (NOTE) Performed At: [facility] [address] [place], [place] [id number] Jolene Schimke MD ZH:[id number]    Source (AFB) SPUTUM  Final    Comment: Performed at [facility], [address] [place], [place] [address]  Fungus Culture With Stain     Status: None (Preliminary result)   Collection Time: 11/12/17  2:21 PM  Result Value Ref Range Status   Fungus Stain Final report  Final    Comment: (NOTE) Performed At: [facility] [address] [place], [place] [id number] Jolene Schimke MD [id number]    Fungus (Mycology) Culture PENDING  Incomplete   Fungal Source SPUTUM  Final    Comment: Performed at [facility], [address] [place], [place] [address]  Fungus Culture Result     Status: None   Collection Time: 11/12/17  2:21 PM  Result Value Ref Range Status   Result 1 Yeast observed   Final    Comment: (NOTE) Performed At: [facility] [address] [place], [place] [id number] Jolene Schimke MD [id number] Performed at [facility], [address] [place], [place] [address]     Radiology Reports Dg Chest Cylinder 1 View  Result Date: 11/15/2017 CLINICAL DATA:  History of tobacco use and chronic asthma EXAM: PORTABLE CHEST 1 VIEW COMPARISON:  None. FINDINGS: Cardiac shadows within normal limits. The lungs are hyperinflated. No focal infiltrate or sizable effusion is seen. Vague nodular density is noted in the right apex. No other focal abnormality is seen. No bony abnormality is noted. IMPRESSION: Vague nodular density in the right lung apex. If prior films could be made available for comparison an addendum could be rendered. Alternatively noncontrast CT is recommended for further evaluation. Electronically Signed   By: Alcide Clever M.D.   On: 11/15/2017 11:17    Lab Data:  CBC: Recent Labs  Lab 11/12/17 0709 11/13/17 0449 11/14/17 1052 11/15/17 0455  WBC 12.3* 10.9* 12.7* 12.6*  NEUTROABS 7.7  --   --   --   HGB 11.8* 11.3* 12.3* 11.3*  HCT 35.9* 34.7* 38.2* 34.2*  MCV 93.7 95.3 95.5 94.5  PLT 653* 692* 834* 770*   Basic Metabolic Panel: Recent Labs  Lab 11/12/17 0709 11/13/17 0449 11/14/17 1052 11/15/17 0455  NA 135 138 137 138  K 4.2 4.2 4.4 4.0  CL 101 102 100* 101  CO2 28 28 30 28   GLUCOSE 100* 117* 106* 91  BUN 6 10 11 12   CREATININE 0.60* 0.68 0.54* 0.60*  CALCIUM 9.1 9.2 9.6 9.4   GFR:  Estimated Creatinine Clearance: 85.9 mL/min (A) (by C-G formula based on SCr of 0.6 mg/dL (L)). Liver Function Tests: Recent Labs  Lab 11/13/17 0449 11/14/17 1052  AST 22 23  ALT 34 37  ALKPHOS 48 48  BILITOT 0.1* 0.2*  PROT 6.5 7.3  ALBUMIN 2.7* 3.1*   No results for input(s): LIPASE, AMYLASE in the last 168 hours. No results for input(s): AMMONIA in the last 168 hours. Coagulation Profile: No results for input(s): INR, PROTIME  in the last 168 hours. Cardiac Enzymes: No results for input(s): CKTOTAL, CKMB, CKMBINDEX, TROPONINI in the last 168 hours. BNP (last 3 results) No results for input(s): PROBNP in the last 8760 hours. HbA1C: No results for input(s): HGBA1C in the last 72 hours. CBG: No results for input(s): GLUCAP in the last 168 hours. Lipid Profile: No results for input(s): CHOL, HDL, LDLCALC, TRIG, CHOLHDL, LDLDIRECT in the last 72 hours. Thyroid Function Tests: No results for input(s): TSH, T4TOTAL, FREET4, T3FREE, THYROIDAB in the last 72 hours. Anemia Panel: No results for input(s): VITAMINB12, FOLATE, FERRITIN, TIBC, IRON, RETICCTPCT in the last 72 hours. Urine analysis: No results found for: COLORURINE, APPEARANCEUR, LABSPEC, PHURINE, GLUCOSEU, HGBUR, BILIRUBINUR, KETONESUR, PROTEINUR, UROBILINOGEN, NITRITE, LEUKOCYTESUR   Ripudeep Rai M.D. [facility] 11/15/2017, 3:20 PM  Pager: [phone] Between 7am to 7pm - call Pager - [phone]  After 7pm go to [email or web address] - password [email or web address]  Call night coverage person covering after 7pm

## 2017-11-15 NOTE — Progress Notes (Addendum)
PULMONARY / CRITICAL CARE MEDICINE   Name: Tanner Hayes MRN: [id number] DOB: [birth year]    ADMISSION DATE:  11/11/2017 CONSULTATION DATE:  11/11/2017  REFERRING MD: Dr. Ophelia Charter, [facility]  CHIEF COMPLAINT: Lung nodule  HISTORY OF PRESENT ILLNESS:   52 yo male smoker with hx of ETOH presented with cough, 10 lbs wt loss, and weakness.  Found to have Rt upper lung nodule.  SUBJECTIVE:  Denies cough, chest pain, fever.  VITAL SIGNS: BP 123/76 (BP Location: Right Arm)   Pulse 80   Temp 97.9 F (36.6 C) (Oral)   Resp 17   Ht 5\' 3"  (1.6 m)   Wt 125 lb 7.1 oz (56.9 kg)   SpO2 100%   BMI 22.22 kg/m   INTAKE / OUTPUT: I/O last 3 completed shifts: In: 2061.9 [P.O.:1044; I.V.:693.3; IV Piggyback:324.6] Out: -   PHYSICAL EXAMINATION:  General - pleasant Eyes - pupils reactive ENT - no sinus tenderness, no oral exudate, no LAN, poor dentition Cardiac - regular, no murmur Chest - no wheeze, rales Abd - soft, non tender Ext - no edema Skin - no rashes Neuro - normal strength Psych - normal mood   LABS:  BMET Recent Labs  Lab 11/13/17 0449 11/14/17 1052 11/15/17 0455  NA 138 137 138  K 4.2 4.4 4.0  CL 102 100* 101  CO2 28 30 28   BUN 10 11 12   CREATININE 0.68 0.54* 0.60*  GLUCOSE 117* 106* 91    Electrolytes Recent Labs  Lab 11/13/17 0449 11/14/17 1052 11/15/17 0455  CALCIUM 9.2 9.6 9.4    CBC Recent Labs  Lab 11/13/17 0449 11/14/17 1052 11/15/17 0455  WBC 10.9* 12.7* 12.6*  HGB 11.3* 12.3* 11.3*  HCT 34.7* 38.2* 34.2*  PLT 692* 834* 770*    Coag's No results for input(s): APTT, INR in the last 168 hours.  Sepsis Markers No results for input(s): LATICACIDVEN, PROCALCITON, O2SATVEN in the last 168 hours.  ABG No results for input(s): PHART, PCO2ART, PO2ART in the last 168 hours.  Liver Enzymes Recent Labs  Lab 11/13/17 0449 11/14/17 1052  AST 22 23  ALT 34 37  ALKPHOS 48 48  BILITOT 0.1* 0.2*  ALBUMIN 2.7* 3.1*    Cardiac Enzymes No  results for input(s): TROPONINI, PROBNP in the last 168 hours.  Glucose No results for input(s): GLUCAP in the last 168 hours.  Imaging Dg Chest Port 1 View  Result Date: 11/15/2017 CLINICAL DATA:  History of tobacco use and chronic asthma EXAM: PORTABLE CHEST 1 VIEW COMPARISON:  None. FINDINGS: Cardiac shadows within normal limits. The lungs are hyperinflated. No focal infiltrate or sizable effusion is seen. Vague nodular density is noted in the right apex. No other focal abnormality is seen. No bony abnormality is noted. IMPRESSION: Vague nodular density in the right lung apex. If prior films could be made available for comparison an addendum could be rendered. Alternatively noncontrast CT is recommended for further evaluation. Electronically Signed   By: Alcide Clever M.D.   On: 11/15/2017 11:17     STUDIES:  CT chest 6/20 >> cavitary RUL nodule, advanced emphysema  CULTURES: 6/20 sputum culture 6/20 sputum fungus 6/20 sputum AFB 6/20 quantiferon gold  ANTIBIOTICS: June 20 Unasyn>    ASSESSMENT / PLAN:  Rt upper lung cavitary lesion. - treating for aspiration pneumonia - f/u AFB, Quantiferon Gold - f/u CXR 6/25 - tentatively schedule bronchoscopy for 2 pm on 11/16/17  Tobacco abuse with changes of emphysema on CT chest. - will need  further assessment for COPD as outpt - continue dulera  Coralyn HellingVineet Ruxin Ransome, MD [facility] 11/15/2017, 1:44 PM

## 2017-11-15 NOTE — Progress Notes (Signed)
Occupational Therapy Treatment and Discharge Patient Details Name: Tanner Hayes MRN: [id number] DOB: [birth year] Today's Date: 11/15/2017    History of present illness Pt is a 52 year old mal with a past medical history significant for alcohol abuse and who has smoked 1 pack of cigarettes daily since his teenage years came to our facility from an outside hospital in the setting of a cavitary lesion in the right upper lobe. Pt being worked up for possible tuberculosis.   OT comments  This 52 yo male admitted with above (still trying to rule out TB) presents to acute OT today totally independent in his room environment (unable to leave at this time due to airborne precautions). No further OT needs, and have made PT aware that no further PT needs identified. OT will sign off.  Follow Up Recommendations  No OT follow up;Supervision - Intermittent    Equipment Recommendations  None recommended by OT       Precautions / Restrictions Precautions Precautions: Other (comment) Precaution Comments: AIRBORNE PRECAUTIONS Restrictions Weight Bearing Restrictions: No       Mobility Bed Mobility               General bed mobility comments: sitting EOB upon my arrival  Transfers Overall transfer level: Independent                    Balance Overall balance assessment: Independent(for general tasks in his room)                                         ADL either performed or assessed with clinical judgement   ADL Overall ADL's : Independent                                       General ADL Comments: Pt is able to go about his room without AD and not pushing his IV pole without issues. He can reach up into top cabinet on his tiptoes, bend over to open lower drawers in his room--both without LOB. RN reports he did a shower this AM and pt reports he is doing his own bathing/dressing and moving about his room  (unable due to airborne precautions  to leave his room) Pt able to get to/from bathroom and up/down from toilet Independently     Vision Patient Visual Report: No change from baseline            Cognition Arousal/Alertness: Awake/alert Behavior During Therapy: WFL for tasks assessed/performed Overall Cognitive Status: Within Functional Limits for tasks assessed                                                     Pertinent Vitals/ Pain       Pain Assessment: No/denies pain            Progress Toward Goals  OT Goals(current goals can now be found in the care plan section)  Progress towards OT goals: (pt is independent in his room, and feel he would be so out in hallways when allowed--no over signs of balance issues)     Plan Discharge plan remains appropriate  AM-PAC PT "6 Clicks" Daily Activity     Outcome Measure   Help from another person eating meals?: None Help from another person taking care of personal grooming?: None Help from another person toileting, which includes using toliet, bedpan, or urinal?: None Help from another person bathing (including washing, rinsing, drying)?: None Help from another person to put on and taking off regular upper body clothing?: None Help from another person to put on and taking off regular lower body clothing?: None 6 Click Score: 24    End of Session Equipment Utilized During Treatment: (non)      Activity Tolerance Patient tolerated treatment well   Patient Left (sitting EOB with RN working on IV)   Nurse Communication Other (comment)(IV leaking)        Time: 1610-9604 OT Time Calculation (min): 11 min  Charges: OT General Charges $OT Visit: 1 Visit OT Treatments $Self Care/Home Management : 8-22 mins  Ignacia Palma, OTR/L [phone] 11/15/2017

## 2017-11-15 NOTE — Progress Notes (Signed)
Pt has an order of [facility] Chest Xray, and radiology called that they can't bring the pt down because he is on airborne precaution. Suggest to do Portable CXray at bedside. Clance Boll. Bodenheimer, NP notified.

## 2017-11-16 ENCOUNTER — Inpatient Hospital Stay (HOSPITAL_COMMUNITY): Payer: Self-pay

## 2017-11-16 ENCOUNTER — Encounter (HOSPITAL_COMMUNITY)
Admission: EM | Disposition: A | Discharge status: Home / Self Care | Payer: Self-pay | Source: Other Acute Inpatient Hospital | Attending: Internal Medicine

## 2017-11-16 DIAGNOSIS — J40 Bronchitis, not specified as acute or chronic: Secondary | ICD-10-CM

## 2017-11-16 HISTORY — PX: VIDEO BRONCHOSCOPY: SHX5072

## 2017-11-16 LAB — CULTURE, BLOOD (ROUTINE X 2)
CULTURE: NO GROWTH
Culture: NO GROWTH
Special Requests: ADEQUATE
Special Requests: ADEQUATE

## 2017-11-16 LAB — BASIC METABOLIC PANEL
Anion gap: 12 (ref 5–15)
BUN: 14 mg/dL (ref 6–20)
CHLORIDE: 100 mmol/L (ref 98–111)
CO2: 26 mmol/L (ref 22–32)
CREATININE: 0.67 mg/dL (ref 0.61–1.24)
Calcium: 9.5 mg/dL (ref 8.9–10.3)
Glucose, Bld: 113 mg/dL — ABNORMAL HIGH (ref 70–99)
POTASSIUM: 4.3 mmol/L (ref 3.5–5.1)
SODIUM: 138 mmol/L (ref 135–145)

## 2017-11-16 LAB — CBC
HCT: 34.5 % — ABNORMAL LOW (ref 39.0–52.0)
HEMOGLOBIN: 11.5 g/dL — AB (ref 13.0–17.0)
MCH: 31.5 pg (ref 26.0–34.0)
MCHC: 33.3 g/dL (ref 30.0–36.0)
MCV: 94.5 fL (ref 78.0–100.0)
PLATELETS: 788 10*3/uL — AB (ref 150–400)
RBC: 3.65 MIL/uL — AB (ref 4.22–5.81)
RDW: 14.2 % (ref 11.5–15.5)
WBC: 13.1 10*3/uL — ABNORMAL HIGH (ref 4.0–10.5)

## 2017-11-16 SURGERY — VIDEO BRONCHOSCOPY WITHOUT FLUORO
Anesthesia: Moderate Sedation | Laterality: Bilateral

## 2017-11-16 MED ORDER — PHENYLEPHRINE HCL 0.25 % NA SOLN: 1.0000 | Freq: Four times a day (QID) | NASAL | Status: DC | PRN

## 2017-11-16 MED ORDER — MIDAZOLAM HCL 10 MG/2ML IJ SOLN
INTRAMUSCULAR | Status: DC | PRN
  Administered 2017-11-16: 2 mg via INTRAVENOUS
  Administered 2017-11-16: 1 mg via INTRAVENOUS
  Administered 2017-11-16 (×2): 2 mg via INTRAVENOUS

## 2017-11-16 MED ORDER — LIDOCAINE HCL 2 % EX GEL: 1.0000 "application " | Freq: Once | CUTANEOUS | Status: DC

## 2017-11-16 MED ORDER — FENTANYL CITRATE (PF) 100 MCG/2ML IJ SOLN
INTRAMUSCULAR | Status: AC
  Filled 2017-11-16: qty 4

## 2017-11-16 MED ORDER — MIDAZOLAM HCL 5 MG/ML IJ SOLN
INTRAMUSCULAR | Status: AC
  Filled 2017-11-16: qty 2

## 2017-11-16 MED ORDER — BUTAMBEN-TETRACAINE-BENZOCAINE 2-2-14 % EX AERO: 1.0000 | INHALATION_SPRAY | Freq: Once | CUTANEOUS | Status: DC

## 2017-11-16 MED ORDER — SODIUM CHLORIDE 0.9 % IV SOLN
INTRAVENOUS | Status: DC
  Administered 2017-11-16: 14:00:00 via INTRAVENOUS

## 2017-11-16 MED ORDER — LIDOCAINE HCL (PF) 1 % IJ SOLN
INTRAMUSCULAR | Status: DC | PRN
  Administered 2017-11-16: 6 mL

## 2017-11-16 MED ORDER — FENTANYL CITRATE (PF) 100 MCG/2ML IJ SOLN
INTRAMUSCULAR | Status: DC | PRN
  Administered 2017-11-16 (×3): 50 ug via INTRAVENOUS

## 2017-11-16 NOTE — Progress Notes (Signed)
PULMONARY / CRITICAL CARE MEDICINE   Name: Tanner Hayes MRN: [id number] DOB: 04-Sep-1965    ADMISSION DATE:  11/11/2017 CONSULTATION DATE:  11/11/2017  REFERRING MD: Dr. Ophelia Charter, [facility]  CHIEF COMPLAINT: Lung nodule  HISTORY OF PRESENT ILLNESS:   52 yo male smoker ( 45 pack year smoking history)  with hx of ETOH presented with cough, 10 lbs wt loss, and weakness.  Found to have Rt upper lung nodule.  SUBJECTIVE:  No complaints this morning. Asking if his CXR has changed.On RA in NAD  VITAL SIGNS: BP 102/64 (BP Location: Left Arm)   Pulse 86   Temp 98.1 F (36.7 C) (Oral)   Resp 16   Ht 5\' 3"  (1.6 m)   Wt 125 lb 7.1 oz (56.9 kg)   SpO2 98%   BMI 22.22 kg/m   INTAKE / OUTPUT: I/O last 3 completed shifts: In: 1805 [P.O.:1320; I.V.:85; IV Piggyback:400] Out: -   PHYSICAL EXAMINATION:  General - awake and alert, appropriate Eyes - PERRLA ENT -  NCAT, no sinus tenderness, no oral exudate, no LAN Cardiac - S1, S2. RRR, No RMG Chest - no wheeze, rales, diminished per bases Abd - soft, non tender, BS +, Non-obese Ext - no edema, warm and dry with brisk refill per nailbeds Skin - no rashes, no lesions, intact, warm and dry Neuro - MAE x 4, A&O x 3, Appropriate Psych - Anxious about today's pending procedure   LABS:  BMET Recent Labs  Lab 11/14/17 1052 11/15/17 0455 11/16/17 0457  NA 137 138 138  K 4.4 4.0 4.3  CL 100* 101 100  CO2 30 28 26   BUN 11 12 14   CREATININE 0.54* 0.60* 0.67  GLUCOSE 106* 91 113*    Electrolytes Recent Labs  Lab 11/14/17 1052 11/15/17 0455 11/16/17 0457  CALCIUM 9.6 9.4 9.5    CBC Recent Labs  Lab 11/14/17 1052 11/15/17 0455 11/16/17 0457  WBC 12.7* 12.6* 13.1*  HGB 12.3* 11.3* 11.5*  HCT 38.2* 34.2* 34.5*  PLT 834* 770* 788*    Coag's No results for input(s): APTT, INR in the last 168 hours.  Sepsis Markers No results for input(s): LATICACIDVEN, PROCALCITON, O2SATVEN in the last 168 hours.  ABG No results for  input(s): PHART, PCO2ART, PO2ART in the last 168 hours.  Liver Enzymes Recent Labs  Lab 11/13/17 0449 11/14/17 1052  AST 22 23  ALT 34 37  ALKPHOS 48 48  BILITOT 0.1* 0.2*  ALBUMIN 2.7* 3.1*    Cardiac Enzymes No results for input(s): TROPONINI, PROBNP in the last 168 hours.  Glucose No results for input(s): GLUCAP in the last 168 hours.  Imaging Dg Chest Port 1 View  Result Date: 11/15/2017 CLINICAL DATA:  History of tobacco use and chronic asthma EXAM: PORTABLE CHEST 1 VIEW COMPARISON:  None. FINDINGS: Cardiac shadows within normal limits. The lungs are hyperinflated. No focal infiltrate or sizable effusion is seen. Vague nodular density is noted in the right apex. No other focal abnormality is seen. No bony abnormality is noted. IMPRESSION: Vague nodular density in the right lung apex. If prior films could be made available for comparison an addendum could be rendered. Alternatively noncontrast CT is recommended for further evaluation. Electronically Signed   By: Alcide Clever M.D.   On: 11/15/2017 11:17     STUDIES:  CT chest 6/20 >> cavitary RUL nodule, advanced emphysema CXR 6/25>> Previously seen nodular density is better aligned on the current exam with the anterior aspect of the right  second rib. This may simply represent some exuberant calcification.  Changes of COPD. CULTURES: 6/20 sputum culture 6/20 sputum fungus 6/21 sputum AFB>> Negative 6/20 quantiferon gold 6/21 sputum fungus  ANTIBIOTICS: June 20 Unasyn>    ASSESSMENT / PLAN:  Rt upper lung cavitary lesion. - continue treating for aspiration pneumonia - f/u micro>> AFB, Quantiferon Gold - f/u CXR 6/25 - NPO for procedure - tentatively schedule bronchoscopy for 2 pm on 11/16/17 for cultures and tissue sampling  Tobacco abuse with changes of emphysema on CT chest. - will need further assessment for COPD as outpt  - continue scheduled dulera - continue Duonebs Q 4 prn - Smoking cessation  counseling   Bevelyn NgoSarah F. Groce, AGACNP-BC [place] [facility]/Critical Care 11/16/2017, 9:12 AM

## 2017-11-16 NOTE — Op Note (Signed)
[facility] Cardiopulmonary Patient Name: Tanner Hayes Date: 11/16/2017 MRN: [id number] Attending MD: Chesley Mires , MD Date of Birth: 29-Nov-1965 CSN: Finalized Age: 52 Admit Type: Inpatient Gender: Male Procedure:            Bronchoscopy Indications:          Right upper lobe cavitary lesion Providers:            Chesley Mires, MD, Cherre Huger RRT, RCP, Ashley Mariner                        RRT,RCP Referring MD:          Medicines:            Fentanyl 150 mcg IV, Midazolam 7 mg IV, Lidocaine 2%                        applied to cords 6 mL Complications:        No immediate complications Estimated Blood Loss: Estimated blood loss: none. Procedure:            Pre-Anesthesia Assessment:                       - A History and Physical has been performed. The                        patient's medications, allergies and sensitivities have                        been reviewed.                       - The risks and benefits of the procedure and the                        sedation options and risks were discussed with the                        patient. All questions were answered and informed                        consent was obtained.                       After obtaining informed consent, the bronchoscope was                        passed under direct vision. Throughout the procedure,                        the patient's blood pressure, pulse, and oxygen                        saturations were monitored continuously. the [id number]                        [id number] scope was introduced through the mouth and                        advanced to the tracheobronchial tree of both lungs.  The procedure was accomplished without difficulty. The                        patient tolerated the procedure well. The total                        duration of the procedure was 12 minutes. Scope In: 2:18:52 PM Scope Out: 2:22:51 PM Findings:      The oropharynx  appears normal. The larynx appears normal. The vocal       cords appear normal. The subglottic space is normal. The trachea is of       normal caliber. The carina is sharp. The tracheobronchial tree was       examined to at least the first subsegmental level. Bronchial mucosa and       anatomy are normal; there are no endobronchial lesions, and no       secretions.      Bronchoalveolar lavage was performed in the right upper lobe of the       lung. 120 mL of fluid were instilled. 30 mL were returned. The return       was cloudy. Impression:           - The airway examination was normal.                       - Bronchoalveolar lavage was performed.                       - Right upper lobe cavitary lesion Moderate Sedation:      The administration of moderate sedation was initiated at 09:12 AM.      Moderate (conscious) sedation was personally administered by the       pulmonologist. The following parameters were monitored: oxygen       saturation, heart rate, blood pressure, and response to care. Total       physician intraservice time was 12 minutes. Recommendation:       - Await test results. Procedure Code(s):    --- Professional ---                       [phone], Bronchoscopy, rigid or flexible, including                        fluoroscopic guidance, when performed; with bronchial                        alveolar lavage                       [id number], Moderate sedation services provided by the same                        physician or other qualified health care professional                        performing the diagnostic or therapeutic service that                        the sedation supports, requiring the presence of an                        independent trained observer  to assist in the                        monitoring of the patient's level of consciousness and                        physiological status; initial 15 minutes of                        intraservice time, patient age 42  years or older Diagnosis Code(s):    --- Professional ---                       R91.8, Other nonspecific abnormal finding of lung field CPT copyright 2017 [employer]. All rights reserved. The codes documented in this report are preliminary and upon coder review may  be revised to meet current compliance requirements. Chesley Mires, MD Chesley Mires, MD 11/16/2017 2:35:05 PM This report has been signed electronically. Number of Addenda: 0

## 2017-11-16 NOTE — Progress Notes (Signed)
Video bronchoscopy performed Intervention bronchial washings Pt tolerated well  Tuleen Mandelbaum David RRT  

## 2017-11-16 NOTE — Progress Notes (Signed)
[facility]                                                                              Patient Demographics  Tanner Hayes, is a 52 y.o. male, DOB - [birth year], [id number]  Admit date - 11/11/2017   Admitting Physician Eduard Clos, MD  Outpatient Primary MD for the patient is Default, Provider, MD  Outpatient specialists:   LOS - 5  days   Medical records reviewed and are as summarized below:    No chief complaint on file.      Brief summary   52 y.o.malewith medical history significant ofCOPD and ETOH abuse presenting as a transfer from [employer]. He went to the hospital a week ago with sided pain just inferior to his breast since Tuesday. Much worse with coughing, been coughing up yellowish-brownish sputum. They went to [facility] in [place], patient had a noncontrast CT and was found to have leukocytosis and question of liver damage.  Patient was discharged the next day.  CT scan at [facility] showed a 10x 4.8x 0.2 cm cavitary mass in the right upper lobe extending into the right hilum with what looks to be evidence of invasion into the right posterior superior mediastinum".  Patient was admitted for further work-up.   Assessment & Plan    Principal Problem: Right upper lobe cavitary pneumonia -Currently afebrile, slight cough no fevers or chills -Pulmonology was consulted, AFB sputum x1-, fungal cultures negative so far, QuantiFERON-TB in process, histoplasma negative.  Blood cultures negative so far -Continue IV Unasyn. -Pulmonology consulted, plan for bronchoscopy for cultures and tissue sampling today at 2 PM - N.p.o.   Active Problems:   COPD with chronic bronchitis (HCC) -Stable, no wheezing, continue Dulera, duo nebs as needed     Tobacco dependence -Counseled strongly on smoking cessation, nicotine patch    Alcohol dependence (HCC) -Continue CIWA with Ativan, thiamine, folate -Not in any  withdrawals    Severe protein-calorie malnutrition (HCC) -Continue nutritional supplements  Code Status: Full code DVT Prophylaxis: SCDs Family Communication: Discussed in detail with the patient, all imaging results, lab results explained to the patient and sister at the bedside   Disposition Plan: Once cleared by pulmonology  Time Spent in minutes   25 minutes  Procedures:  None  Consultants:   Pulmonology  Antimicrobials:      Medications  Scheduled Meds: . carvedilol  3.125 mg Oral BID WC  . feeding supplement (ENSURE ENLIVE)  237 mL Oral BID BM  . feeding supplement (PRO-STAT SUGAR FREE 64)  30 mL Oral BID  . folic acid  1 mg Oral Daily  . mometasone-formoterol  2 puff Inhalation BID  . multivitamin with minerals  1 tablet Oral Daily  . nicotine  14 mg Transdermal Daily  . thiamine  50 mg Oral BID   Continuous Infusions: . sodium chloride Stopped (11/15/17 1504)  . ampicillin-sulbactam (UNASYN) IV 3 g (11/16/17 0832)   PRN Meds:.ipratropium-albuterol, oxyCODONE   Antibiotics   Anti-infectives (From admission, onward)   Start     Dose/Rate Route Frequency Ordered Stop   11/11/17 1500  Ampicillin-Sulbactam (  UNASYN) 3 g in sodium chloride 0.9 % 100 mL IVPB     3 g 200 mL/hr over 30 Minutes Intravenous Every 6 hours 11/11/17 1422          Subjective:   Kartel Six was seen and examined today.  Feeling better, denies any specific complaints.  Low-grade temp of 99.9 F.  No shortness of breath.   Patient denies dizziness, chest pain, abdominal pain, N/V/D/C, new weakness, numbess, tingling. No acute events overnight.    Objective:   Vitals:   11/15/17 2000 11/15/17 2116 11/16/17 0514 11/16/17 0819  BP:  119/81 102/64   Pulse:  88 86   Resp:  18 16   Temp:  99.9 F (37.7 C) 98.1 F (36.7 C)   TempSrc:  Oral Oral   SpO2: 99% 100% 100% 98%  Weight:      Height:        Intake/Output Summary (Last 24 hours) at 11/16/2017 1229 Last data filed  at 11/16/2017 0412 Gross per 24 hour  Intake 885 ml  Output -  Net 885 ml     Wt Readings from Last 3 Encounters:  11/11/17 56.9 kg (125 lb 7.1 oz)     Exam General: Alert and oriented x 3, NAD Eyes:  HEENT:  Cardiovascular: S1 S2 auscultated, Regular rate and rhythm. No pedal edema b/l Respiratory: Diminished breath sound at the base Gastrointestinal: Soft, nontender, nondistended, + bowel sounds Ext: no pedal edema bilaterally Neuro: no new deficits Musculoskeletal: No digital cyanosis, clubbing Skin: No rashes Psych: Normal affect and demeanor, alert and oriented x3      Data Reviewed:  I have personally reviewed following labs and imaging studies  Micro Results Recent Results (from the past 240 hour(s))  MRSA PCR Screening     Status: None   Collection Time: 11/11/17  1:28 PM  Result Value Ref Range Status   MRSA by PCR NEGATIVE NEGATIVE Final    Comment:        The GeneXpert MRSA Assay (FDA approved for NASAL specimens only), is one component of a comprehensive MRSA colonization surveillance program. It is not intended to diagnose MRSA infection nor to guide or monitor treatment for MRSA infections. Performed at [facility], [address] [place], [place] [address]   Culture, blood (routine x 2) Call MD if unable to obtain prior to antibiotics being given     Status: None   Collection Time: 11/11/17  3:16 PM  Result Value Ref Range Status   Specimen Description BLOOD LEFT ANTECUBITAL  Final   Special Requests   Final    BOTTLES DRAWN AEROBIC AND ANAEROBIC Blood Culture adequate volume   Culture   Final    NO GROWTH 5 DAYS Performed at [facility], [address] [place], [place] [address]    Report Status 11/16/2017 FINAL  Final  Culture, blood (routine x 2) Call MD if unable to obtain prior to antibiotics being given     Status: None   Collection Time: 11/11/17  3:22 PM  Result Value Ref Range Status   Specimen Description BLOOD  RIGHT ANTECUBITAL  Final   Special Requests   Final    BOTTLES DRAWN AEROBIC AND ANAEROBIC Blood Culture adequate volume   Culture   Final    NO GROWTH 5 DAYS Performed at [facility], [address] [place], [place] [address]    Report Status 11/16/2017 FINAL  Final  Culture, expectorated sputum-assessment  Status: None   Collection Time: 11/12/17  2:21 PM  Result Value Ref Range Status   Specimen Description SPUTUM  Final   Special Requests NONE  Final   Sputum evaluation   Final    Sputum specimen not acceptable for testing.  Please recollect.   Gram Stain Report Called to,Read Back By and Verified With: RN Cecil Cranker [phone] [id number] MLM Performed at [facility], [address] [place], [place] [address]    Report Status 11/12/2017 FINAL  Final  Acid Fast Smear (AFB)     Status: None   Collection Time: 11/12/17  2:21 PM  Result Value Ref Range Status   AFB Specimen Processing Concentration  Final   Acid Fast Smear Negative  Final    Comment: (NOTE) Performed At: [facility] [address] [place], [place] [id number] Jolene Schimke MD [id number]    Source (AFB) SPUTUM  Final    Comment: Performed at [facility], [address], [place], [place] [address]  Fungus Culture With Stain     Status: None (Preliminary result)   Collection Time: 11/12/17  2:21 PM  Result Value Ref Range Status   Fungus Stain Final report  Final    Comment: (NOTE) Performed At: [facility] [address] [place], [place] [id number] Jolene Schimke MD [id number]    Fungus (Mycology) Culture PENDING  Incomplete   Fungal Source SPUTUM  Final    Comment: Performed at [facility], [address] [place], [place] [address]  Fungus Culture Result     Status: None   Collection Time: 11/12/17  2:21 PM  Result Value Ref Range Status   Result 1 Yeast observed  Final    Comment: (NOTE) Performed At: [facility] [address] [place], [place]  [id number] Jolene Schimke MD [id number] Performed at [facility], [address], [place], [place] [address]     Radiology Reports Dg Chest 2 View  Result Date: 11/16/2017 CLINICAL DATA:  History of right-sided chest pain, follow-up possible nodular density EXAM: CHEST - 2 VIEW COMPARISON:  11/15/2017 FINDINGS: Cardiac shadow is stable. Lungs are again hyper aerated without focal infiltrate or sizable effusion. Previously seen nodular density is less well appreciated and more aligned with the anterior aspect of the right second rib and may simply be related to calcifications. No other focal abnormality is noted. IMPRESSION: Previously seen nodular density is better aligned on the current exam with the anterior aspect of the right second rib. This may simply represent some exuberant calcification. Changes of COPD. Electronically Signed   By: Alcide Clever M.D.   On: 11/16/2017 09:24   Dg Chest Port 1 View  Result Date: 11/15/2017 CLINICAL DATA:  History of tobacco use and chronic asthma EXAM: PORTABLE CHEST 1 VIEW COMPARISON:  None. FINDINGS: Cardiac shadows within normal limits. The lungs are hyperinflated. No focal infiltrate or sizable effusion is seen. Vague nodular density is noted in the right apex. No other focal abnormality is seen. No bony abnormality is noted. IMPRESSION: Vague nodular density in the right lung apex. If prior films could be made available for comparison an addendum could be rendered. Alternatively noncontrast CT is recommended for further evaluation. Electronically Signed   By: Alcide Clever M.D.   On: 11/15/2017 11:17    Lab Data:  CBC: Recent Labs  Lab 11/12/17 0709 11/13/17 0449 11/14/17 1052 11/15/17 0455 11/16/17 0457  WBC 12.3* 10.9* 12.7* 12.6* 13.1*  NEUTROABS 7.7  --   --   --   --  HGB 11.8* 11.3* 12.3* 11.3* 11.5*  HCT 35.9* 34.7* 38.2* 34.2* 34.5*  MCV 93.7 95.3 95.5 94.5 94.5  PLT 653* 692* 834* 770* 788*   Basic Metabolic  Panel: Recent Labs  Lab 11/12/17 0709 11/13/17 0449 11/14/17 1052 11/15/17 0455 11/16/17 0457  NA 135 138 137 138 138  K 4.2 4.2 4.4 4.0 4.3  CL 101 102 100* 101 100  CO2 28 28 30 28 26   GLUCOSE 100* 117* 106* 91 113*  BUN 6 10 11 12 14   CREATININE 0.60* 0.68 0.54* 0.60* 0.67  CALCIUM 9.1 9.2 9.6 9.4 9.5   GFR: Estimated Creatinine Clearance: 85.9 mL/min (by C-G formula based on SCr of 0.67 mg/dL). Liver Function Tests: Recent Labs  Lab 11/13/17 0449 11/14/17 1052  AST 22 23  ALT 34 37  ALKPHOS 48 48  BILITOT 0.1* 0.2*  PROT 6.5 7.3  ALBUMIN 2.7* 3.1*   No results for input(s): LIPASE, AMYLASE in the last 168 hours. No results for input(s): AMMONIA in the last 168 hours. Coagulation Profile: No results for input(s): INR, PROTIME in the last 168 hours. Cardiac Enzymes: No results for input(s): CKTOTAL, CKMB, CKMBINDEX, TROPONINI in the last 168 hours. BNP (last 3 results) No results for input(s): PROBNP in the last 8760 hours. HbA1C: No results for input(s): HGBA1C in the last 72 hours. CBG: No results for input(s): GLUCAP in the last 168 hours. Lipid Profile: No results for input(s): CHOL, HDL, LDLCALC, TRIG, CHOLHDL, LDLDIRECT in the last 72 hours. Thyroid Function Tests: No results for input(s): TSH, T4TOTAL, FREET4, T3FREE, THYROIDAB in the last 72 hours. Anemia Panel: No results for input(s): VITAMINB12, FOLATE, FERRITIN, TIBC, IRON, RETICCTPCT in the last 72 hours. Urine analysis: No results found for: COLORURINE, APPEARANCEUR, LABSPEC, PHURINE, GLUCOSEU, HGBUR, BILIRUBINUR, KETONESUR, PROTEINUR, UROBILINOGEN, NITRITE, LEUKOCYTESUR   Ripudeep Rai M.D. [facility] 11/16/2017, 12:29 PM  Pager: [phone] Between 7am to 7pm - call Pager - [phone]  After 7pm go to [email or web address] - password [email or web address]  Call night coverage person covering after 7pm

## 2017-11-16 NOTE — Progress Notes (Signed)
PT Cancellation Note and Discharge  Patient Details Name: Tanner ElkDarwin Dunshee MRN: [id number] DOB: [birth year]   Cancelled Treatment:    Reason Eval/Treat Not Completed: Discussed pt case with OT who states that pt is independent in the room environment at this time without AD or IV pole for support. Unable to leave room due to air borne precautions so not able to attempt stair training. Do not anticipate pt will have any difficulty with negotiating stairs due to current functional level. Will sign off at this time. If needs change, please reconsult.    Marylynn PearsonLaura D Keaunna Skipper 11/16/2017, 9:36 AM   Conni SlipperLaura Alexus Galka, PT, DPT [facility] Rehabilitation Services Pager: [phone]

## 2017-11-17 ENCOUNTER — Encounter (HOSPITAL_COMMUNITY): Payer: Self-pay | Admitting: Pulmonary Disease

## 2017-11-17 ENCOUNTER — Inpatient Hospital Stay (HOSPITAL_COMMUNITY): Payer: Self-pay

## 2017-11-17 LAB — QUANTIFERON-TB GOLD PLUS (RQFGPL)
QUANTIFERON TB1 AG VALUE: 0.03 [IU]/mL
QuantiFERON Mitogen Value: 7.06 IU/mL
QuantiFERON Nil Value: 0.05 IU/mL
QuantiFERON TB2 Ag Value: 0.03 IU/mL

## 2017-11-17 LAB — BASIC METABOLIC PANEL
Anion gap: 12 (ref 5–15)
BUN: 17 mg/dL (ref 6–20)
CALCIUM: 9.4 mg/dL (ref 8.9–10.3)
CO2: 26 mmol/L (ref 22–32)
CREATININE: 0.62 mg/dL (ref 0.61–1.24)
Chloride: 102 mmol/L (ref 98–111)
GFR calc non Af Amer: >60 mL/min (ref >60)
Glucose, Bld: 122 mg/dL — ABNORMAL HIGH (ref 70–99)
Potassium: 4.2 mmol/L (ref 3.5–5.1)
Sodium: 140 mmol/L (ref 135–145)

## 2017-11-17 LAB — QUANTIFERON-TB GOLD PLUS: QuantiFERON-TB Gold Plus: NEGATIVE

## 2017-11-17 LAB — CBC
HEMATOCRIT: 36.1 % — AB (ref 39.0–52.0)
HEMOGLOBIN: 11.6 g/dL — AB (ref 13.0–17.0)
MCH: 31 pg (ref 26.0–34.0)
MCHC: 32.1 g/dL (ref 30.0–36.0)
MCV: 96.5 fL (ref 78.0–100.0)
Platelets: 830 10*3/uL — ABNORMAL HIGH (ref 150–400)
RBC: 3.74 MIL/uL — ABNORMAL LOW (ref 4.22–5.81)
RDW: 14.5 % (ref 11.5–15.5)
WBC: 12.9 10*3/uL — ABNORMAL HIGH (ref 4.0–10.5)

## 2017-11-17 LAB — ACID FAST SMEAR (AFB, MYCOBACTERIA)

## 2017-11-17 LAB — ACID FAST SMEAR (AFB): ACID FAST SMEAR - AFSCU2: NEGATIVE

## 2017-11-17 NOTE — Progress Notes (Signed)
Bronchoscopy performed 6/25.  Cultures / AFB pending.  Note moderate WBC on gram stain from BAL.  PCCM will up when cultures returned. Please call sooner if new needs arise.    Noe Gens, NP-C [facility] Pgr: [phone] or if no answer [phone] 11/17/2017, 9:35 AM

## 2017-11-17 NOTE — Progress Notes (Signed)
PROGRESS NOTE    Tanner Hayes  WUX:[id number] DOB: 02/06/1966 DOA: 11/11/2017 PCP: Default, Provider, MD     Brief Narrative:  Tanner Hayes is a 52 y.o.malewith medical history significant ofCOPD and ETOH abuse presenting as a transfer from [employer]. He went to the hospital a week ago with pain just inferior to his breast since Tuesday. Much worse with coughing, been coughing up yellowish-brownish sputum. They went to [facility] in [place], patient had a noncontrast CT and was found to have leukocytosis and question of liver damage.  Patient was discharged the next day.  CT scan at [facility] showed a 10x 4.8x 0.2 cm cavitary mass in the right upper lobe extending into the right hilum with what looks to be evidence of invasion into the right posterior superior mediastinum".  Patient was admitted for further work-up and he underwent bronchoscopy 6/25.  New events last 24 hours / Subjective: No new events, no new complaints today.  Eager to go home.  Assessment & Plan:   Principal Problem:   Cavitary pneumonia Active Problems:   COPD with chronic bronchitis ([place])   Tobacco dependence   Alcohol dependence ([place])   Severe protein-calorie malnutrition ([place])  Right upper lobe cavitary pneumonia -Pulmonology was consulted, AFB sputum x1 negative, fungal cultures showed yeast so far, QuantiFERON-TB negative, histoplasma negative.  Blood cultures negative -S/p bronchoscopy 6/25 await final results  -Cytology negative  -Continue IV Unasyn  HTN -continue coreg  COPD with chronic bronchitis  -Stable, no wheezing, continue Dulera, duo nebs as needed   Tobacco dependence -Counseled strongly on smoking cessation, nicotine patch  Alcohol dependence  -Not in any withdrawals  Severe protein-calorie malnutrition -Continue nutritional supplements   DVT prophylaxis: SCD  Code Status: Full Family Communication: At bedside Disposition Plan: Pending work  up, pulmonology recommendations   Consultants:   Pulmonology  Procedures:   Bronchoscopy 6/25  Antimicrobials:  Anti-infectives (From admission, onward)   Start     Dose/Rate Route Frequency Ordered Stop   11/11/17 1500  Ampicillin-Sulbactam (UNASYN) 3 g in sodium chloride 0.9 % 100 mL IVPB     3 g 200 mL/hr over 30 Minutes Intravenous Every 6 hours 11/11/17 1422         Objective: Vitals:   11/16/17 2040 11/16/17 2115 11/17/17 0541 11/17/17 0805  BP: 120/64  118/64   Pulse: 88  79   Resp: 18  16   Temp: (!) 97.5 F (36.4 C)  97.6 F (36.4 C)   TempSrc: Oral  Oral   SpO2: 99% 98% 99% 99%  Weight:      Height:        Intake/Output Summary (Last 24 hours) at 11/17/2017 1356 Last data filed at 11/17/2017 0000 Gross per 24 hour  Intake 437.25 ml  Output -  Net 437.25 ml   Filed Weights   11/11/17 1447 11/11/17 2246  Weight: 56.7 kg (125 lb) 56.9 kg (125 lb 7.1 oz)    Examination:  General exam: Appears calm and comfortable  Respiratory system: Distant breath sounds but clear overall. On room air. Respiratory effort normal. Cardiovascular system: S1 & S2 heard, RRR. No JVD, murmurs, rubs, gallops or clicks. No pedal edema. Gastrointestinal system: Abdomen is nondistended, soft and nontender. No organomegaly or masses felt. Normal bowel sounds heard. Central nervous system: Alert and oriented. No focal neurological deficits. Extremities: Symmetric 5 x 5 power. Skin: No rashes, lesions or ulcers Psychiatry: Judgement and insight appear normal. Mood & affect appropriate.  Data Reviewed: I have personally reviewed following labs and imaging studies  CBC: Recent Labs  Lab 11/12/17 0709 11/13/17 0449 11/14/17 1052 11/15/17 0455 11/16/17 0457 11/17/17 0422  WBC 12.3* 10.9* 12.7* 12.6* 13.1* 12.9*  NEUTROABS 7.7  --   --   --   --   --   HGB 11.8* 11.3* 12.3* 11.3* 11.5* 11.6*  HCT 35.9* 34.7* 38.2* 34.2* 34.5* 36.1*  MCV 93.7 95.3 95.5 94.5 94.5 96.5  PLT  653* 692* 834* 770* 788* 160*   Basic Metabolic Panel: Recent Labs  Lab 11/13/17 0449 11/14/17 1052 11/15/17 0455 11/16/17 0457 11/17/17 0422  NA 138 137 138 138 140  K 4.2 4.4 4.0 4.3 4.2  CL 102 100* 101 100 102  CO2 _0 GLUCOSE 117* 106* 91 113* 122*  BUN _1 CREATININE 0.68 0.54* 0.60* 0.67 0.62  CALCIUM 9.2 9.6 9.4 9.5 9.4   GFR: Estimated Creatinine Clearance: 85.9 mL/min (by C-G formula based on SCr of 0.62 mg/dL). Liver Function Tests: Recent Labs  Lab 11/13/17 0449 11/14/17 1052  AST 22 23  ALT 34 37  ALKPHOS 48 48  BILITOT 0.1* 0.2*  PROT 6.5 7.3  ALBUMIN 2.7* 3.1*   No results for input(s): LIPASE, AMYLASE in the last 168 hours. No results for input(s): AMMONIA in the last 168 hours. Coagulation Profile: No results for input(s): INR, PROTIME in the last 168 hours. Cardiac Enzymes: No results for input(s): CKTOTAL, CKMB, CKMBINDEX, TROPONINI in the last 168 hours. BNP (last 3 results) No results for input(s): PROBNP in the last 8760 hours. HbA1C: No results for input(s): HGBA1C in the last 72 hours. CBG: No results for input(s): GLUCAP in the last 168 hours. Lipid Profile: No results for input(s): CHOL, HDL, LDLCALC, TRIG, CHOLHDL, LDLDIRECT in the last 72 hours. Thyroid Function Tests: No results for input(s): TSH, T4TOTAL, FREET4, T3FREE, THYROIDAB in the last 72 hours. Anemia Panel: No results for input(s): VITAMINB12, FOLATE, FERRITIN, TIBC, IRON, RETICCTPCT in the last 72 hours. Sepsis Labs: No results for input(s): PROCALCITON, LATICACIDVEN in the last 168 hours.  Recent Results (from the past 240 hour(s))  MRSA PCR Screening     Status: None   Collection Time: 11/11/17  1:28 PM  Result Value Ref Range Status   MRSA by PCR NEGATIVE NEGATIVE Final    Comment:        The GeneXpert MRSA Assay (FDA approved for NASAL specimens only), is one component of a comprehensive MRSA colonization surveillance program. It is  not intended to diagnose MRSA infection nor to guide or monitor treatment for MRSA infections. Performed at [facility], [place] [address] [place], [place] [address]   Culture, blood (routine x 2) Call MD if unable to obtain prior to antibiotics being given     Status: None   Collection Time: 11/11/17  3:16 PM  Result Value Ref Range Status   Specimen Description BLOOD LEFT ANTECUBITAL  Final   Special Requests   Final    BOTTLES DRAWN AEROBIC AND ANAEROBIC Blood Culture adequate volume   Culture   Final    NO GROWTH 5 DAYS Performed at [facility], [place] [address], [place], [place] [address]    Report Status 11/16/2017 FINAL  Final  Culture, blood (routine x 2) Call MD if unable to obtain prior to antibiotics being given     Status: None   Collection Time: 11/11/17  3:22 PM  Result Value Ref Range Status   Specimen Description BLOOD RIGHT ANTECUBITAL  Final   Special Requests   Final    BOTTLES DRAWN AEROBIC AND ANAEROBIC Blood Culture adequate volume   Culture   Final    NO GROWTH 5 DAYS Performed at [facility], [address], [place], [place] [address]    Report Status 11/16/2017 FINAL  Final  Culture, expectorated sputum-assessment     Status: None   Collection Time: 11/12/17  2:21 PM  Result Value Ref Range Status   Specimen Description SPUTUM  Final   Special Requests NONE  Final   Sputum evaluation   Final    Sputum specimen not acceptable for testing.  Please recollect.   Gram Stain Report Called to,Read Back By and Verified With: RN Norlene Campbell [phone] MLM Performed at [facility], [place] [address] [place], [place] [address]    Report Status 11/12/2017 FINAL  Final  Acid Fast Smear (AFB)     Status: None   Collection Time: 11/12/17  2:21 PM  Result Value Ref Range Status   AFB Specimen Processing Concentration  Final   Acid Fast Smear Negative  Final    Comment: (NOTE) Performed At: [facility] [place],  [place] [id number] Rush Farmer MD [id number]    Source (AFB) SPUTUM  Final    Comment: Performed at [facility], [place] [address], [place], [place] [address]  Fungus Culture With Stain     Status: None (Preliminary result)   Collection Time: 11/12/17  2:21 PM  Result Value Ref Range Status   Fungus Stain Final report  Final    Comment: (NOTE) Performed At: [facility] [place], [place] [id number] Rush Farmer MD [id number]    Fungus (Mycology) Culture PENDING  Incomplete   Fungal Source SPUTUM  Final    Comment: Performed at [facility], [place] [address], [place], [place] [address]  Fungus Culture Result     Status: None   Collection Time: 11/12/17  2:21 PM  Result Value Ref Range Status   Result 1 Yeast observed  Final    Comment: (NOTE) Performed At: [facility] [place], [place] [id number] Rush Farmer MD [id number] Performed at [facility], [place] [address], [place], [place] [address]   Culture, bal-quantitative     Status: None (Preliminary result)   Collection Time: 11/16/17  2:20 PM  Result Value Ref Range Status   Specimen Description BRONCHIAL ALVEOLAR LAVAGE  Final   Special Requests Normal  Final   Gram Stain   Final    MODERATE WBC PRESENT, PREDOMINANTLY MONONUCLEAR NO ORGANISMS SEEN    Culture   Final    NO GROWTH < 24 HOURS Performed at [facility], [place] [address], [place], [place] [address]    Report Status PENDING  Incomplete       Radiology Studies: Dg Chest 2 View  Result Date: 11/16/2017 CLINICAL DATA:  History of right-sided chest pain, follow-up possible nodular density EXAM: CHEST - 2 VIEW COMPARISON:  11/15/2017 FINDINGS: Cardiac shadow is stable. Lungs are again hyper aerated without focal infiltrate or sizable effusion. Previously seen nodular density is less well appreciated and more aligned with the anterior aspect of the right second rib and may simply be related  to calcifications. No other focal abnormality is noted. IMPRESSION: Previously seen nodular density is better aligned on the current exam with  the anterior aspect of the right second rib. This may simply represent some exuberant calcification. Changes of COPD. Electronically Signed   By: Inez Catalina M.D.   On: 11/16/2017 09:24   Dg Chest Port 1 View  Result Date: 11/17/2017 CLINICAL DATA:  Status post bronchoscopy EXAM: PORTABLE CHEST 1 VIEW COMPARISON:  11/16/2017 FINDINGS: Cardiac shadow is stable. No pneumothorax is identified. Previously seen nodular density is again less well visualized. No new focal abnormality is noted. IMPRESSION: No post bronchoscopy pneumothorax is noted. Electronically Signed   By: Inez Catalina M.D.   On: 11/17/2017 09:12      Scheduled Meds: . carvedilol  3.125 mg Oral BID WC  . feeding supplement (ENSURE ENLIVE)  237 mL Oral BID BM  . feeding supplement (PRO-STAT SUGAR FREE 64)  30 mL Oral BID  . folic acid  1 mg Oral Daily  . mometasone-formoterol  2 puff Inhalation BID  . multivitamin with minerals  1 tablet Oral Daily  . nicotine  14 mg Transdermal Daily  . thiamine  50 mg Oral BID   Continuous Infusions: . sodium chloride Stopped (11/15/17 2112)  . sodium chloride 10 mL/hr at 11/16/17 1401  . ampicillin-sulbactam (UNASYN) IV 3 g (11/17/17 0827)     LOS: 6 days    Time spent: 35 minutes   Dessa Phi, DO [facility] [email or web address] Password [email or web address] 11/17/2017, 1:56 PM

## 2017-11-18 DIAGNOSIS — J189 Pneumonia, unspecified organism: Secondary | ICD-10-CM

## 2017-11-18 LAB — CBC
HEMATOCRIT: 36.8 % — AB (ref 39.0–52.0)
HEMOGLOBIN: 11.8 g/dL — AB (ref 13.0–17.0)
MCH: 30.5 pg (ref 26.0–34.0)
MCHC: 32.1 g/dL (ref 30.0–36.0)
MCV: 95.1 fL (ref 78.0–100.0)
Platelets: 837 10*3/uL — ABNORMAL HIGH (ref 150–400)
RBC: 3.87 MIL/uL — ABNORMAL LOW (ref 4.22–5.81)
RDW: 14.5 % (ref 11.5–15.5)
WBC: 14.1 10*3/uL — ABNORMAL HIGH (ref 4.0–10.5)

## 2017-11-18 LAB — BASIC METABOLIC PANEL
Anion gap: 10 (ref 5–15)
BUN: 16 mg/dL (ref 6–20)
CHLORIDE: 100 mmol/L (ref 98–111)
CO2: 29 mmol/L (ref 22–32)
CREATININE: 0.57 mg/dL — AB (ref 0.61–1.24)
Calcium: 9.9 mg/dL (ref 8.9–10.3)
GFR calc non Af Amer: >60 mL/min (ref >60)
Glucose, Bld: 96 mg/dL (ref 70–99)
Potassium: 4.2 mmol/L (ref 3.5–5.1)
Sodium: 139 mmol/L (ref 135–145)

## 2017-11-18 LAB — CULTURE, BAL-QUANTITATIVE W GRAM STAIN
Culture: NO GROWTH
Special Requests: NORMAL

## 2017-11-18 MED ORDER — AMOXICILLIN-POT CLAVULANATE 875-125 MG PO TABS: 1.0000 | ORAL_TABLET | Freq: Two times a day (BID) | ORAL | 0 refills | Status: AC

## 2017-11-18 NOTE — Care Management (Signed)
1305 11-18-17 CM did speak with patient in regards to [facility]. Pt is declining HH Services at this time. No further needs from this CM.  Previous Note from CM Heather reads as:   Patient states his pcp is [facility] [place] [phone] and he has a follow up apt on 7/15, he is on a program there for his inhalers and his sister [name] pays for his other medications which comes to total of 20.00. Pt  will be staying with sister at dc.

## 2017-11-18 NOTE — Progress Notes (Signed)
PULMONARY / CRITICAL CARE MEDICINE   Name: Tanner Hayes MRN: [id number] DOB: 09-26-1965    ADMISSION DATE:  11/11/2017 CONSULTATION DATE:  11/11/2017  REFERRING MD: Dr. Lorin Mercy, [facility]  CHIEF COMPLAINT: Lung nodule  HISTORY OF PRESENT ILLNESS:   52 yo male smoker ( 45 pack year smoking history)  with hx of ETOH presented with cough, 10 lbs wt loss, and weakness.  Found to have Rt upper lung nodule.  SUBJECTIVE:  Feels better  VITAL SIGNS: Blood Pressure (Abnormal) 101/52 (BP Location: Left Arm)   Pulse 94   Temperature 98.2 F (36.8 C) (Oral)   Respiration 17   Height _0  (1.6 m)   Weight 125 lb 7.1 oz (56.9 kg)   Oxygen Saturation 99%   Body Mass Index 22.22 kg/m   INTAKE / OUTPUT: I/O last 3 completed shifts: In: 0867 [P.O.:940; I.V.:252; IV Piggyback:300] Out: -   PHYSICAL EXAMINATION: General- no distress.  HENT NCAT no JVD  pulm no accessory use clear  Card rrr  abd non-tender  Neuro intact  LABS:  BMET Recent Labs  Lab 11/16/17 0457 11/17/17 0422 11/18/17 0632  NA 138 140 139  K 4.3 4.2 4.2  CL 100 102 100  CO2 _1 BUN _2 CREATININE 0.67 0.62 0.57*  GLUCOSE 113* 122* 96    Electrolytes Recent Labs  Lab 11/16/17 0457 11/17/17 0422 11/18/17 0632  CALCIUM 9.5 9.4 9.9    CBC Recent Labs  Lab 11/16/17 0457 11/17/17 0422 11/18/17 0632  WBC 13.1* 12.9* 14.1*  HGB 11.5* 11.6* 11.8*  HCT 34.5* 36.1* 36.8*  PLT 788* 830* 837*    Coag's No results for input(s): APTT, INR in the last 168 hours.  Sepsis Markers No results for input(s): LATICACIDVEN, PROCALCITON, O2SATVEN in the last 168 hours.  ABG No results for input(s): PHART, PCO2ART, PO2ART in the last 168 hours.  Liver Enzymes Recent Labs  Lab 11/13/17 0449 11/14/17 1052  AST 22 23  ALT 34 37  ALKPHOS 48 48  BILITOT 0.1* 0.2*  ALBUMIN 2.7* 3.1*    Cardiac Enzymes No results for input(s): TROPONINI, PROBNP in the last 168 hours.  Glucose No results  for input(s): GLUCAP in the last 168 hours.  Imaging No results found.   STUDIES:  CT chest 6/20 >> cavitary RUL nodule, advanced emphysema CXR 6/25>> Previously seen nodular density is better aligned on the current exam with the anterior aspect of the right second rib. This may simply represent some exuberant calcification.  Changes of COPD. CULTURES: 6/20 sputum culture 6/20 sputum fungus 6/21 sputum AFB>> Negative 6/20 quantiferon gold 6/21 sputum fungus  ANTIBIOTICS: June 20 Unasyn>    ASSESSMENT / PLAN:  Rt upper lung cavitary lesion. S/p FOB on 6/25 -cytology was negative from BAL -histo neg -afb neg -QFG neg -fungal did show yeast but stain pending -cxr w/out new findings Plan/rec Would change to augmentin Will need at least 3-4 weeks abx (would send him w/ 4 weeks) We will make him f/u in our clinic will need f/u no contrasted CT chest prior to that.    Tobacco abuse with changes of emphysema on CT chest. Plan F/u out pt  Cont BDs Needs PFTs   Erick Colace ACNP-BC [place] Pager # [phone] OR # [phone] if no answer

## 2017-11-18 NOTE — Discharge Instructions (Signed)
Please have your PCP order a non-contrasted CT of chest so that we can review it.  It is to follow up for Lung abscess  Please be sure to bring a copied disc (Not just the print out report) so that we can review the imaging.   You are welcome to see a pulmonary provider closer to home if that works out better for you. Please be sure to notify us if you choose to do that so that we can cancel the appointment here in [place]   [phone]

## 2017-11-18 NOTE — Discharge Summary (Signed)
Physician Discharge Summary  Tanner Hayes OZY:[id number] DOB: Mar 05, 1966 DOA: 11/11/2017  PCP: Default, Provider, MD  Admit date: 11/11/2017 Discharge date: 11/18/2017  Admitted From: Home Disposition:  Home  Recommendations for Outpatient Follow-up:  1. Follow up with PCP in 1 week 2. Continue Augmentin for 4 weeks 3. Follow up non contrast CT chest, then have imaging CD sent to pulmonology office 4. Follow up with pulm on 12/16/2017  Discharge Condition: Stable CODE STATUS: Full  Diet recommendation: Heart healthy   Brief/Interim Summary: Tanner Hayes is a 52 y.o.malewith medical history significant ofCOPD and ETOH abuse presenting as a transfer from [facility]. He went to the hospital a week ago with pain just inferior to his breast since Tuesday. Much worse with coughing, been coughing up yellowish-brownish sputum. They went to [facility] in [place], patient had a noncontrast CT and was found to have leukocytosis and question of liver damage. Patient was discharged the next day. CT scan at [facility] showed a 10x 4.8x 0.2 cm cavitary mass in the right upper lobe extending into the right hilum with what looks to be evidence of invasion into the right posterior superior mediastinum". Patient was admitted for further work-up and he underwent bronchoscopy 6/25. Results have come back mostly unremarkable. Pulmonology recommending 3-4 weeks of Augmentin, then repeat noncontrast CT chest and to follow up in their office.   Discharge Diagnoses:  Principal Problem:   Cavitary pneumonia Active Problems:   COPD with chronic bronchitis ([place])   Tobacco dependence   Alcohol dependence ([place])   Severe protein-calorie malnutrition ([place])   Right upper lobe cavitary pneumonia -Pulmonology was consulted, AFB sputum negative, fungal cultures showed yeast so far, QuantiFERON-TBnegative, histoplasma negative. Blood cultures negative -S/p bronchoscopy 6/25 -Cytology  negative  -Continue Augmentin 3-4 weeks, prescription provided for 4 weeks -Repeat CT chest noncontrast and follow up in office with pulm 12/16/2017  HTN -Continue coreg  COPD with chronic bronchitis  -Stable, no wheezing, continue Dulera, duo nebs as needed  Tobacco dependence -Counseled strongly on smoking cessation, nicotine patch  Alcohol dependence  -Not in any withdrawals  Severe protein-calorie malnutrition -Continue nutritional supplements     Discharge Instructions   Allergies as of 11/18/2017   No Known Allergies     Medication List    TAKE these medications   ADVAIR DISKUS IN Inhale 1 puff into the lungs 2 (two) times daily.   amoxicillin-clavulanate 875-125 MG tablet Commonly known as:  AUGMENTIN Take 1 tablet by mouth 2 (two) times daily for 28 days.   budesonide-formoterol 160-4.5 MCG/ACT inhaler Commonly known as:  SYMBICORT Inhale 2 puffs into the lungs 2 (two) times daily.   carvedilol 3.125 MG tablet Commonly known as:  COREG Take 3.125 mg by mouth 2 (two) times daily with a meal.   folic acid 1 MG tablet Commonly known as:  FOLVITE Take 1 mg by mouth daily.   nicotine 14 mg/24hr patch Commonly known as:  NICODERM CQ - dosed in mg/24 hours Place 14 mg onto the skin daily.   ONE-A-DAY 50 PLUS PO Take 1 tablet by mouth daily.   thiamine 50 MG tablet Commonly known as:  VITAMIN B-1 Take 50 mg by mouth 2 (two) times daily.      Follow-up Information    [facility] Follow up on 12/06/2017.   Why:  has follow up apt scheduled Contact information: [phone]       Parrett, Fonnie Mu, NP Follow  up on 12/16/2017.   Specialty:  Pulmonary Disease Why:  at 2pm  Contact information: [address] [address] [phone]          No Known Allergies  Consultations:  Pulmonology    Procedures/Studies: Dg Chest 2 View  Result Date: 11/16/2017 CLINICAL DATA:  History of  right-sided chest pain, follow-up possible nodular density EXAM: CHEST - 2 VIEW COMPARISON:  11/15/2017 FINDINGS: Cardiac shadow is stable. Lungs are again hyper aerated without focal infiltrate or sizable effusion. Previously seen nodular density is less well appreciated and more aligned with the anterior aspect of the right second rib and may simply be related to calcifications. No other focal abnormality is noted. IMPRESSION: Previously seen nodular density is better aligned on the current exam with the anterior aspect of the right second rib. This may simply represent some exuberant calcification. Changes of COPD. Electronically Signed   By: Inez Catalina M.D.   On: 11/16/2017 09:24   Dg Chest Port 1 View  Result Date: 11/17/2017 CLINICAL DATA:  Status post bronchoscopy EXAM: PORTABLE CHEST 1 VIEW COMPARISON:  11/16/2017 FINDINGS: Cardiac shadow is stable. No pneumothorax is identified. Previously seen nodular density is again less well visualized. No new focal abnormality is noted. IMPRESSION: No post bronchoscopy pneumothorax is noted. Electronically Signed   By: Inez Catalina M.D.   On: 11/17/2017 09:12   Dg Chest Port 1 View  Result Date: 11/15/2017 CLINICAL DATA:  History of tobacco use and chronic asthma EXAM: PORTABLE CHEST 1 VIEW COMPARISON:  None. FINDINGS: Cardiac shadows within normal limits. The lungs are hyperinflated. No focal infiltrate or sizable effusion is seen. Vague nodular density is noted in the right apex. No other focal abnormality is seen. No bony abnormality is noted. IMPRESSION: Vague nodular density in the right lung apex. If prior films could be made available for comparison an addendum could be rendered. Alternatively noncontrast CT is recommended for further evaluation. Electronically Signed   By: Inez Catalina M.D.   On: 11/15/2017 11:17    Bronchoscopy 6/25    Discharge Exam: Vitals:   11/18/17 0439 11/18/17 0806  BP: (!) 101/52   Pulse: 94   Resp:    Temp: 98.2  F (36.8 C)   SpO2: 98% 99%    General: Pt is alert, awake, not in acute distress Cardiovascular: RRR, S1/S2 +, no rubs, no gallops Respiratory: CTA bilaterally, distant lung sounds, no wheezing, no rhonchi Abdominal: Soft, NT, ND, bowel sounds + Extremities: no edema, no cyanosis    The results of significant diagnostics from this hospitalization (including imaging, microbiology, ancillary and laboratory) are listed below for reference.     Microbiology: Recent Results (from the past 240 hour(s))  MRSA PCR Screening     Status: None   Collection Time: 11/11/17  1:28 PM  Result Value Ref Range Status   MRSA by PCR NEGATIVE NEGATIVE Final    Comment:        The GeneXpert MRSA Assay (FDA approved for NASAL specimens only), is one component of a comprehensive MRSA colonization surveillance program. It is not intended to diagnose MRSA infection nor to guide or monitor treatment for MRSA infections. Performed at [facility], [place] [address] [place], [place] [address]   Culture, blood (routine x 2) Call MD if unable to obtain prior to antibiotics being given     Status: None   Collection Time: 11/11/17  3:16 PM  Result Value Ref Range Status  Specimen Description BLOOD LEFT ANTECUBITAL  Final   Special Requests   Final    BOTTLES DRAWN AEROBIC AND ANAEROBIC Blood Culture adequate volume   Culture   Final    NO GROWTH 5 DAYS Performed at [facility], [address], [place], [place] [address]    Report Status 11/16/2017 FINAL  Final  Culture, blood (routine x 2) Call MD if unable to obtain prior to antibiotics being given     Status: None   Collection Time: 11/11/17  3:22 PM  Result Value Ref Range Status   Specimen Description BLOOD RIGHT ANTECUBITAL  Final   Special Requests   Final    BOTTLES DRAWN AEROBIC AND ANAEROBIC Blood Culture adequate volume   Culture   Final    NO GROWTH 5 DAYS Performed at [facility], [place] [address], [place],  [place] [address]    Report Status 11/16/2017 FINAL  Final  Culture, expectorated sputum-assessment     Status: None   Collection Time: 11/12/17  2:21 PM  Result Value Ref Range Status   Specimen Description SPUTUM  Final   Special Requests NONE  Final   Sputum evaluation   Final    Sputum specimen not acceptable for testing.  Please recollect.   Gram Stain Report Called to,Read Back By and Verified With: RN Norlene Campbell [id number] Performed at [facility], [place] [address], [place], [place] [address]    Report Status 11/12/2017 FINAL  Final  Acid Fast Smear (AFB)     Status: None   Collection Time: 11/12/17  2:21 PM  Result Value Ref Range Status   AFB Specimen Processing Concentration  Final   Acid Fast Smear Negative  Final    Comment: (NOTE) Performed At: [facility] [place], [place] [id number] Rush Farmer MD [id number]    Source (AFB) SPUTUM  Final    Comment: Performed at [facility], [place] [address], [place], [place] [address]  Fungus Culture With Stain     Status: None (Preliminary result)   Collection Time: 11/12/17  2:21 PM  Result Value Ref Range Status   Fungus Stain Final report  Final    Comment: (NOTE) Performed At: [facility] [place], [place] [id number] Rush Farmer MD IO:[id number]    Fungus (Mycology) Culture PENDING  Incomplete   Fungal Source SPUTUM  Final    Comment: Performed at [facility], [place] [address] [place], [place] [address]  Fungus Culture Result     Status: None   Collection Time: 11/12/17  2:21 PM  Result Value Ref Range Status   Result 1 Yeast observed  Final    Comment: (NOTE) Performed At: [facility] [place], [place] [id number] Rush Farmer MD QM:[id number] Performed at [facility], [place] [address], [place],  [address]   Culture, bal-quantitative     Status: None (Preliminary result)   Collection Time: 11/16/17  2:20 PM   Result Value Ref Range Status   Specimen Description BRONCHIAL ALVEOLAR LAVAGE  Final   Special Requests Normal  Final   Gram Stain   Final    MODERATE WBC PRESENT, PREDOMINANTLY MONONUCLEAR NO ORGANISMS SEEN    Culture   Final    NO GROWTH 2 DAYS Performed at [facility], [place] [address], [place], [place] [address]    Report Status PENDING  Incomplete  Acid Fast Smear (AFB)  Status: None   Collection Time: 11/16/17  2:20 PM  Result Value Ref Range Status   AFB Specimen Processing Concentration  Final   Acid Fast Smear Negative  Final    Comment: (NOTE) Performed At: [facility] [place], [place] [id number] Rush Farmer MD [id number]    Source (AFB) BRONCHIAL ALVEOLAR LAVAGE  Final    Comment: Performed at [facility], [place] [address], [place], [place] [address]     Labs: BNP (last 3 results) No results for input(s): BNP in the last 8760 hours. Basic Metabolic Panel: Recent Labs  Lab 11/14/17 1052 11/15/17 0455 11/16/17 0457 11/17/17 0422 11/18/17 0632  NA 137 138 138 140 139  K 4.4 4.0 4.3 4.2 4.2  CL 100* 101 100 102 100  CO2 _0 GLUCOSE 106* 91 113* 122* 96  BUN _1 CREATININE 0.54* 0.60* 0.67 0.62 0.57*  CALCIUM 9.6 9.4 9.5 9.4 9.9   Liver Function Tests: Recent Labs  Lab 11/13/17 0449 11/14/17 1052  AST 22 23  ALT 34 37  ALKPHOS 48 48  BILITOT 0.1* 0.2*  PROT 6.5 7.3  ALBUMIN 2.7* 3.1*   No results for input(s): LIPASE, AMYLASE in the last 168 hours. No results for input(s): AMMONIA in the last 168 hours. CBC: Recent Labs  Lab 11/12/17 0709  11/14/17 1052 11/15/17 0455 11/16/17 0457 11/17/17 0422 11/18/17 0632  WBC 12.3*   < > 12.7* 12.6* 13.1* 12.9* 14.1*  NEUTROABS 7.7  --   --   --   --   --   --   HGB 11.8*   < > 12.3* 11.3* 11.5* 11.6* 11.8*  HCT 35.9*   < > 38.2* 34.2* 34.5* 36.1* 36.8*  MCV 93.7   < > 95.5 94.5 94.5 96.5 95.1  PLT 653*   < > 834* 770* 788* 830*  837*   < > = values in this interval not displayed.   Cardiac Enzymes: No results for input(s): CKTOTAL, CKMB, CKMBINDEX, TROPONINI in the last 168 hours. BNP: Invalid input(s): POCBNP CBG: No results for input(s): GLUCAP in the last 168 hours. D-Dimer No results for input(s): DDIMER in the last 72 hours. Hgb A1c No results for input(s): HGBA1C in the last 72 hours. Lipid Profile No results for input(s): CHOL, HDL, LDLCALC, TRIG, CHOLHDL, LDLDIRECT in the last 72 hours. Thyroid function studies No results for input(s): TSH, T4TOTAL, T3FREE, THYROIDAB in the last 72 hours.  Invalid input(s): FREET3 Anemia work up No results for input(s): VITAMINB12, FOLATE, FERRITIN, TIBC, IRON, RETICCTPCT in the last 72 hours. Urinalysis No results found for: COLORURINE, APPEARANCEUR, [place], [place], [place], [place], [place], [place], PROTEINUR, UROBILINOGEN, NITRITE, LEUKOCYTESUR Sepsis Labs Invalid input(s): PROCALCITONIN,  WBC,  LACTICIDVEN Microbiology Recent Results (from the past 240 hour(s))  MRSA PCR Screening     Status: None   Collection Time: 11/11/17  1:28 PM  Result Value Ref Range Status   MRSA by PCR NEGATIVE NEGATIVE Final    Comment:        The GeneXpert MRSA Assay (FDA approved for NASAL specimens only), is one component of a comprehensive MRSA colonization surveillance program. It is not intended to diagnose MRSA infection nor to guide or monitor treatment for MRSA infections. Performed at [facility], [place] [address] [place] [address], [place], [place] [address]   Culture, blood (routine x 2) Call MD if unable to obtain prior to antibiotics being given  Status: None   Collection Time: 11/11/17  3:16 PM  Result Value Ref Range Status   Specimen Description BLOOD LEFT ANTECUBITAL  Final   Special Requests   Final    BOTTLES DRAWN AEROBIC AND ANAEROBIC Blood Culture adequate volume   Culture   Final    NO GROWTH 5 DAYS Performed at [facility], [address] [place], [place] [address]    Report Status 11/16/2017 FINAL  Final  Culture, blood (routine x 2) Call MD if unable to obtain prior to antibiotics being given     Status: None   Collection Time: 11/11/17  3:22 PM  Result Value Ref Range Status   Specimen Description BLOOD RIGHT ANTECUBITAL  Final   Special Requests   Final    BOTTLES DRAWN AEROBIC AND ANAEROBIC Blood Culture adequate volume   Culture   Final    NO GROWTH 5 DAYS Performed at [facility], [place] [address] [place], [place] [address]    Report Status 11/16/2017 FINAL  Final  Culture, expectorated sputum-assessment     Status: None   Collection Time: 11/12/17  2:21 PM  Result Value Ref Range Status   Specimen Description SPUTUM  Final   Special Requests NONE  Final   Sputum evaluation   Final    Sputum specimen not acceptable for testing.  Please recollect.   Gram Stain Report Called to,Read Back By and Verified With: RN Norlene Campbell [phone] MLM Performed at [facility], [place] [address] [place], [place] [address]    Report Status 11/12/2017 FINAL  Final  Acid Fast Smear (AFB)     Status: None   Collection Time: 11/12/17  2:21 PM  Result Value Ref Range Status   AFB Specimen Processing Concentration  Final   Acid Fast Smear Negative  Final    Comment: (NOTE) Performed At: [facility] [place], [place] [id number] Rush Farmer MD GO:[id number]    Source (AFB) SPUTUM  Final    Comment: Performed at [facility], [place] [address] [place], [place] [address]  Fungus Culture With Stain     Status: None (Preliminary result)   Collection Time: 11/12/17  2:21 PM  Result Value Ref Range Status   Fungus Stain Final report  Final    Comment: (NOTE) Performed At: [facility], [place], [place] [id number] Rush Farmer MD OE:[id number]    Fungus (Mycology) Culture PENDING  Incomplete   Fungal Source SPUTUM  Final    Comment: Performed at [facility], [place] [address] [place], [place] [address]  Fungus Culture Result     Status: None   Collection Time: 11/12/17  2:21 PM  Result Value Ref Range Status   Result 1 Yeast observed  Final    Comment: (NOTE) Performed At: [facility] [place], [place] [id number] Rush Farmer MD [id number] Performed at [facility], [place] [address] [place], [place] [address]   Culture, bal-quantitative     Status: None (Preliminary result)   Collection Time: 11/16/17  2:20 PM  Result Value Ref Range Status   Specimen Description BRONCHIAL ALVEOLAR LAVAGE  Final   Special Requests Normal  Final   Gram Stain   Final    MODERATE WBC PRESENT, PREDOMINANTLY MONONUCLEAR NO ORGANISMS SEEN    Culture   Final    NO GROWTH 2 DAYS Performed at [facility], [place]  [address] [place], [place] [address]    Report Status PENDING  Incomplete  Acid Fast Smear (AFB)     Status: None   Collection Time: 11/16/17  2:20 PM  Result Value Ref Range Status   AFB Specimen Processing Concentration  Final   Acid Fast Smear Negative  Final    Comment: (NOTE) Performed At: [facility] [place], [place] [id number] Rush Farmer MD KZ:[id number]    Source (AFB) BRONCHIAL ALVEOLAR LAVAGE  Final    Comment: Performed at [facility], [place] [address] [place], [place] [address]     Patient was seen and examined on the day of discharge and was found to be in stable condition. Time coordinating discharge: 25 minutes including assessment and coordination of care, as well as examination of the patient.   SIGNED:  Dessa Phi, DO [facility] Pager [phone]  If 7PM-7AM, please contact night-coverage [email or web address] Password [facility] 11/18/2017, 11:58 AM

## 2017-11-18 NOTE — Progress Notes (Signed)
Patient discharged to home with instructions and prescription. 

## 2017-11-19 NOTE — Progress Notes (Signed)
[facility]'s significant other, that she forgot about the home medications she gave or submitted to the nurse at [facility] when they got admitted. I was able to retrieve the meds from pharmacy and endorsed to night charge RN. I made Samantha aware that I have the medications and will endorse it to charge nurse and should be ready for pick up but she claimed that they are 2 hours away from here, so she requested to have it shipped to them. Gregery NaLanisha Hunter AD was made aware and instructed me to let Ac know if its possible to have it shipped to pt.Marland Kitchen. Made AC aware awaiting response. [name] said that she will call on Monday to request leadership to have it shipped.

## 2017-11-19 NOTE — Progress Notes (Deleted)
[facility]'s significant other, that she forgot about the home medications  she gave to the nurse at [facility] when they got admitted. I was able to retrieve the medications from pharmacy , notified [name] that I have the medications, and will endorced it to the night charge nurse. [name] claimed that they are 2 hours away from here and requested to have it shipped to them. AD Gregery NaLanisha Hunter was made aware and instructed [facility] to [facility] to be [facility], but [facility]. [name] will call back on Monday and speak to leadership to request to have it shipped.

## 2017-12-08 LAB — FUNGUS CULTURE WITH STAIN

## 2017-12-08 LAB — FUNGUS CULTURE RESULT

## 2017-12-08 LAB — FUNGAL ORGANISM REFLEX

## 2017-12-16 ENCOUNTER — Inpatient Hospital Stay: Payer: Medicaid Other | Admitting: Adult Health

## 2017-12-16 ENCOUNTER — Inpatient Hospital Stay: Payer: Medicaid Other | Admitting: Pulmonary Disease

## 2017-12-16 LAB — FUNGUS CULTURE WITH STAIN

## 2017-12-16 LAB — FUNGAL ORGANISM REFLEX

## 2017-12-16 LAB — FUNGUS CULTURE RESULT

## 2017-12-29 LAB — ACID FAST CULTURE WITH REFLEXED SENSITIVITIES (MYCOBACTERIA): Acid Fast Culture: NEGATIVE

## 2018-01-03 LAB — ACID FAST CULTURE WITH REFLEXED SENSITIVITIES

## 2018-01-03 LAB — ACID FAST CULTURE WITH REFLEXED SENSITIVITIES (MYCOBACTERIA): Acid Fast Culture: NEGATIVE

## 2019-08-23 IMAGING — DX DG CHEST 1V PORT
1 series · 1 of 1 positions shown · non-contrast
Comparison: None.

CLINICAL DATA: History of tobacco use and chronic asthma

EXAM:
PORTABLE CHEST 1 VIEW

[chest ap]
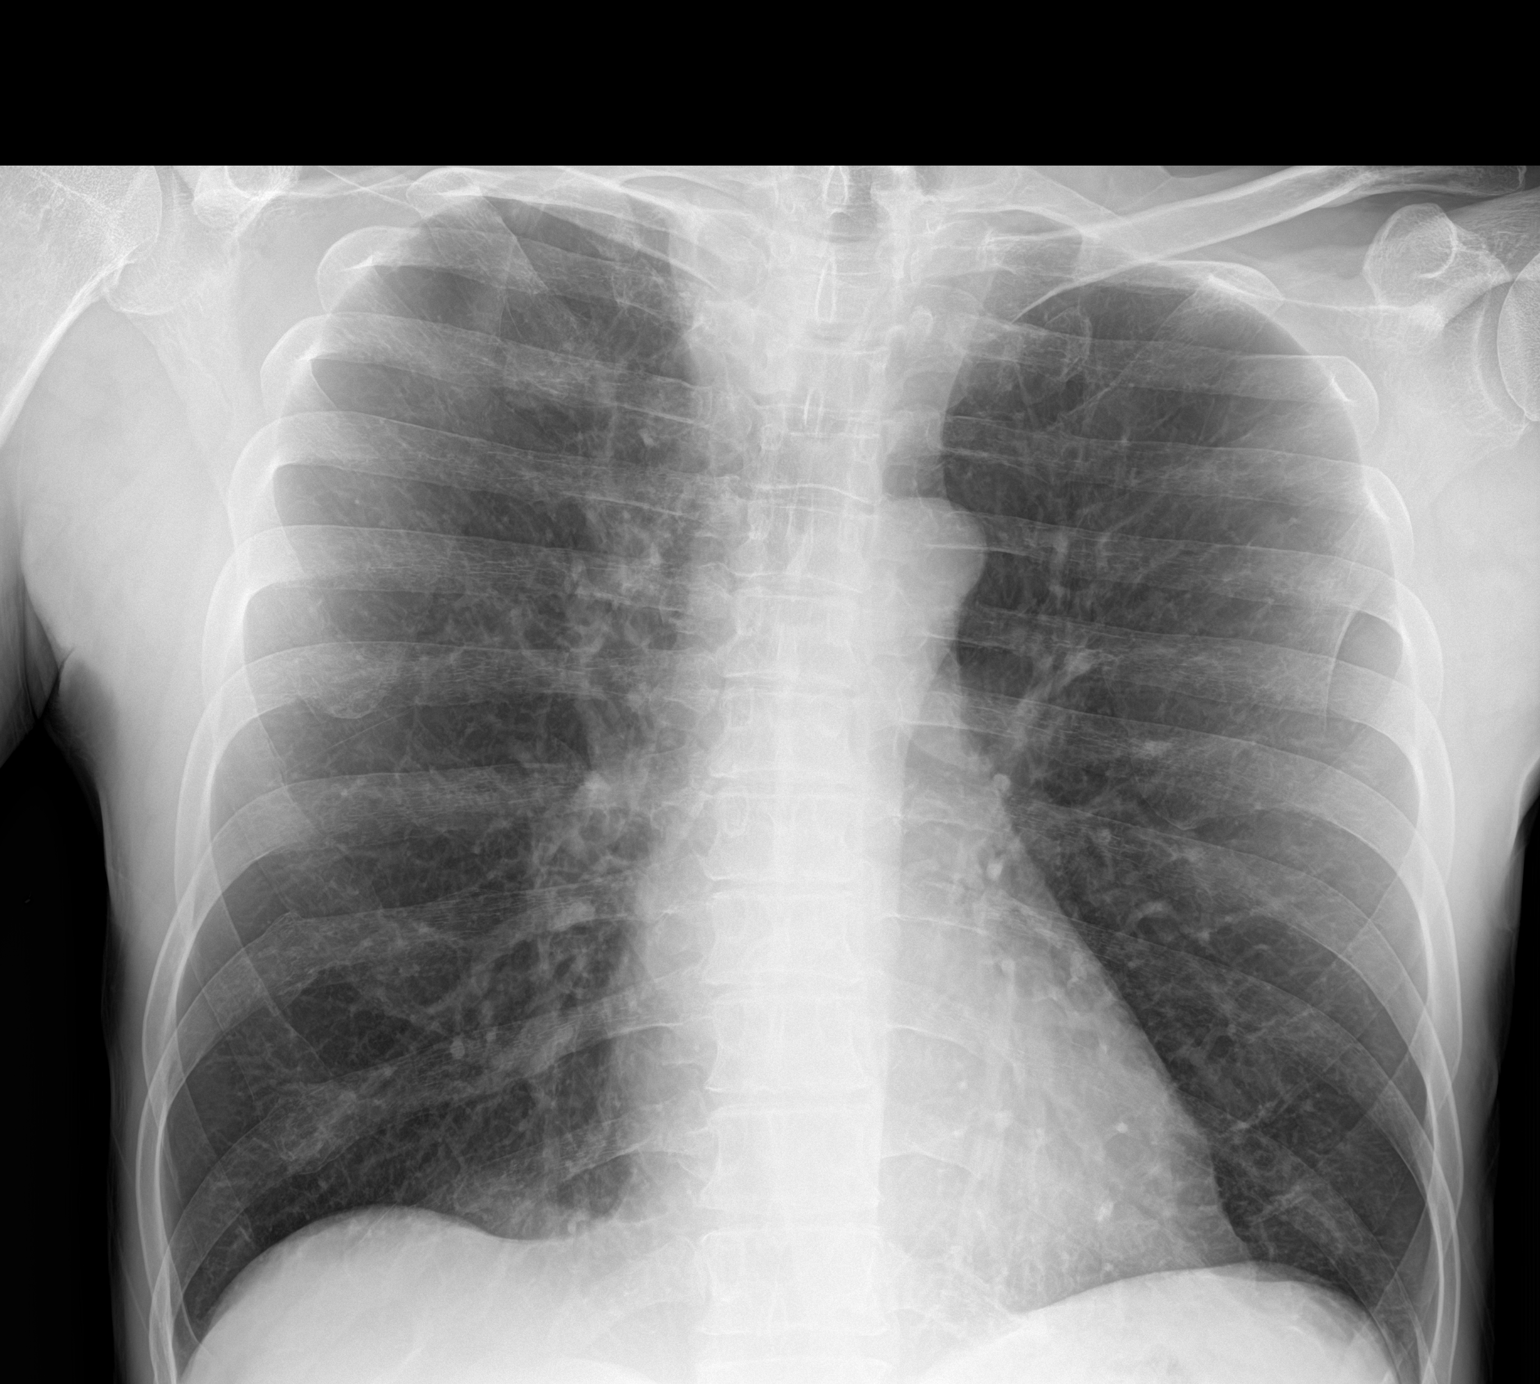

[1 of 1 positions shown; findings below may reference images not displayed]

FINDINGS: Cardiac shadows within normal limits. The lungs are hyperinflated.
No focal infiltrate or sizable effusion is seen. Vague nodular
density is noted in the right apex. No other focal abnormality is
seen. No bony abnormality is noted.
IMPRESSION: Vague nodular density in the right lung apex. If prior films could
be made available for comparison an addendum could be rendered.
Alternatively noncontrast CT is recommended for further evaluation.
